# Patient Record
Sex: Female | Born: 1995 | Race: White | Hispanic: No | Marital: Single | Smoking: Never smoker
Health system: Southern US, Community
[De-identification: ages and names within clinical notes are randomized; demographics above are authoritative.]

## PROBLEM LIST (undated history)

## (undated) DIAGNOSIS — J45909 Unspecified asthma, uncomplicated: Secondary | ICD-10-CM

## (undated) DIAGNOSIS — K219 Gastro-esophageal reflux disease without esophagitis: Secondary | ICD-10-CM

## (undated) DIAGNOSIS — F988 Other specified behavioral and emotional disorders with onset usually occurring in childhood and adolescence: Secondary | ICD-10-CM

## (undated) DIAGNOSIS — F419 Anxiety disorder, unspecified: Secondary | ICD-10-CM

## (undated) HISTORY — DX: Unspecified asthma, uncomplicated: J45.909

## (undated) HISTORY — DX: Anxiety disorder, unspecified: F41.9

## (undated) HISTORY — DX: Gastro-esophageal reflux disease without esophagitis: K21.9

## (undated) HISTORY — DX: Other specified behavioral and emotional disorders with onset usually occurring in childhood and adolescence: F98.8

---
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
## 2014-05-15 ENCOUNTER — Institutional Professional Consult (permissible substitution): Payer: 59 | Admitting: Pediatrics

## 2014-05-15 DIAGNOSIS — R279 Unspecified lack of coordination: Secondary | ICD-10-CM

## 2014-05-15 DIAGNOSIS — F909 Attention-deficit hyperactivity disorder, unspecified type: Secondary | ICD-10-CM

## 2014-06-20 ENCOUNTER — Other Ambulatory Visit: Payer: Self-pay | Admitting: Occupational Medicine

## 2014-06-20 ENCOUNTER — Ambulatory Visit: Payer: Self-pay

## 2014-06-20 DIAGNOSIS — R52 Pain, unspecified: Secondary | ICD-10-CM

## 2014-07-23 ENCOUNTER — Institutional Professional Consult (permissible substitution): Payer: 59 | Admitting: Pediatrics

## 2014-07-23 DIAGNOSIS — R279 Unspecified lack of coordination: Secondary | ICD-10-CM

## 2014-07-23 DIAGNOSIS — F909 Attention-deficit hyperactivity disorder, unspecified type: Secondary | ICD-10-CM

## 2014-11-13 ENCOUNTER — Telehealth: Payer: Self-pay | Admitting: Gynecology

## 2014-11-13 NOTE — Telephone Encounter (Signed)
lmtcb to reschedule AEX from 12/25/14 that has been cancelled.

## 2014-12-10 ENCOUNTER — Institutional Professional Consult (permissible substitution): Payer: 59 | Admitting: Pediatrics

## 2014-12-10 DIAGNOSIS — F902 Attention-deficit hyperactivity disorder, combined type: Secondary | ICD-10-CM

## 2014-12-10 DIAGNOSIS — F82 Specific developmental disorder of motor function: Secondary | ICD-10-CM

## 2014-12-24 ENCOUNTER — Telehealth: Payer: Self-pay | Admitting: Certified Nurse Midwife

## 2014-12-24 NOTE — Telephone Encounter (Signed)
Left message to reschedule aex appointment. °

## 2014-12-25 ENCOUNTER — Ambulatory Visit: Payer: Managed Care, Other (non HMO) | Admitting: Gynecology

## 2014-12-26 ENCOUNTER — Encounter: Payer: Self-pay | Admitting: Nurse Practitioner

## 2014-12-26 ENCOUNTER — Ambulatory Visit (INDEPENDENT_AMBULATORY_CARE_PROVIDER_SITE_OTHER): Payer: Managed Care, Other (non HMO) | Admitting: Nurse Practitioner

## 2014-12-26 VITALS — BP 100/60 | HR 72 | Resp 14 | Ht 70.0 in | Wt 131.6 lb

## 2014-12-26 DIAGNOSIS — Z113 Encounter for screening for infections with a predominantly sexual mode of transmission: Secondary | ICD-10-CM

## 2014-12-26 DIAGNOSIS — Z0000 Encounter for general adult medical examination without abnormal findings: Secondary | ICD-10-CM

## 2014-12-26 DIAGNOSIS — Z01419 Encounter for gynecological examination (general) (routine) without abnormal findings: Secondary | ICD-10-CM

## 2014-12-26 DIAGNOSIS — R5383 Other fatigue: Secondary | ICD-10-CM

## 2014-12-26 LAB — POCT URINALYSIS DIPSTICK
Leukocytes, UA: NEGATIVE
UROBILINOGEN UA: NEGATIVE
pH, UA: 5

## 2014-12-26 MED ORDER — NORETHIN ACE-ETH ESTRAD-FE 1-20 MG-MCG PO TABS
1.0000 | ORAL_TABLET | Freq: Every day | ORAL | Status: DC
Start: 2014-12-26 — End: 2015-11-22

## 2014-12-26 NOTE — Progress Notes (Signed)
19 y.o. G0 Single Caucasian Fe here for annual exam.  Menses on OCP is 5 days.  Moderate to light.  Some cramps for 2-3 days. Helps with OTC NSAID's.  Ended long term relationship in October, not SA since.  While  home during break having some fatigue and insomnia - concerned about this problem with going back to school.  She has reduced activity while at home.  Goes to [facility].  Patient's last menstrual period was 12/25/2014.          Sexually active: No.  The current method of family planning is OCP (estrogen/progesterone).    Exercising: Yes.    Walk daily at school Smoker:  no  Health Maintenance: Pap:  Never had one TDaP:  Summer before college  Labs:Hgb: 14.4 ; Urine; RBC's ++ (Cycle), Trace Protein   reports that she has never smoked. She has never used smokeless tobacco. She reports that she drinks about 0.6 oz of alcohol per week. She reports that she does not use illicit drugs.  Past Medical History  Diagnosis Date  . Asthma     Sports Induced  . ADD (attention deficit disorder) since age 83-8    mild hyperactitivy aspect    Past Surgical History  Procedure Laterality Date  . Appendectomy  2011  . Wisdom tooth extraction  2013    Current Outpatient Prescriptions  Medication Sig Dispense Refill  . ALBUTEROL IN Inhale into the lungs as needed.    Marland Kitchen DOXYCYCLINE PO Take by mouth.    . EPIDUO 0.1-2.5 % gel daily.  0  . methylphenidate (DAYTRANA) 30 MG/9HR Place 1 patch onto the skin daily. wear patch for 9 hours only each day    . norethindrone-ethinyl estradiol (JUNEL FE 1/20) 1-20 MG-MCG tablet Take 1 tablet by mouth daily. 3 Package 3   No current facility-administered medications for this visit.    Family History  Problem Relation Age of Onset  . Thyroid disease Father     Hypothyroidism  . Breast cancer Maternal Grandmother 60  . Anxiety disorder Maternal Grandmother   . Hypertension Maternal Grandfather   . Heart disease Maternal Grandfather   .  Heart disease Paternal Grandfather     ROS:  Pertinent items are noted in HPI.  Otherwise, a comprehensive ROS was negative.  Exam:   BP 100/60 mmHg  Pulse 72  Resp 14  Ht  (1.778 m)  Wt 131 lb 9.6 oz (59.693 kg)  BMI 18.88 kg/m2  LMP 12/25/2014 Height:  (177.8 cm)  Ht Readings from Last 3 Encounters:  12/26/14  (1.778 m) (99 %*, Z = 2.25)  12/24/13  (1.803 m) (100 %*, Z = 2.66)   * Growth percentiles are based on CDC 2-20 Years data.    General appearance: alert, cooperative and appears stated age Head: Normocephalic, without obvious abnormality, atraumatic Neck: no adenopathy, supple, symmetrical, trachea midline and thyroid normal to inspection and palpation Lungs: clear to auscultation bilaterally Breasts: normal appearance, no masses or tenderness Heart: regular rate and rhythm Abdomen: soft, non-tender; no masses,  no organomegaly Extremities: extremities normal, atraumatic, no cyanosis or edema Skin: Skin color, texture, turgor normal. No rashes or lesions Lymph nodes: Cervical, supraclavicular, and axillary nodes normal. No abnormal inguinal nodes palpated Neurologic: Grossly normal   Pelvic: External genitalia:  no lesions              Urethra:  normal appearing urethra with no masses, tenderness or lesions  Bartholin's and Skene's: normal                 Vagina: normal appearing vagina with normal color and discharge, no lesions              Cervix: anteverted              Pap taken: No. Bimanual Exam:  Uterus:  normal size, contour, position, consistency, mobility, non-tender              Adnexa: no mass, fullness, tenderness               Rectovaginal: Confirms               Anus:  normal sphincter tone, no lesions  A:  Well Woman with normal exam  Contraception  Med's for Acne  History of ADD  R/O STD's  Fatigue and insomnia  P:   Reviewed health and wellness pertinent to exam  Pap smear not taken today  Refill on  Loestrin for a year, aware if BTB without missed pills may need higher dose of pill.  Follow with labs  Counseled on breast self exam, STD prevention, use and side effects of OCP's, adequate intake of calcium and vitamin D, diet and exercise return annually or prn  An After Visit Summary was printed and given to the patient.

## 2014-12-26 NOTE — Patient Instructions (Signed)
General topics  Next pap or exam is  due in 1 year Take a Women's multivitamin Take 1200 mg. of calcium daily - prefer dietary If any concerns in interim to call back  Breast Self-Awareness Practicing breast self-awareness may pick up problems early, prevent significant medical complications, and possibly save your life. By practicing breast self-awareness, you can become familiar with how your breasts look and feel and if your breasts are changing. This allows you to notice changes early. It can also offer you some reassurance that your breast health is good. One way to learn what is normal for your breasts and whether your breasts are changing is to do a breast self-exam. If you find a lump or something that was not present in the past, it is best to contact your caregiver right away. Other findings that should be evaluated by your caregiver include nipple discharge, especially if it is bloody; skin changes or reddening; areas where the skin seems to be pulled in (retracted); or new lumps and bumps. Breast pain is seldom associated with cancer (malignancy), but should also be evaluated by a caregiver. BREAST SELF-EXAM The best time to examine your breasts is 5 7 days after your menstrual period is over.  ExitCare Patient Information 2013 ExitCare, LLC.   Exercise to Stay Healthy Exercise helps you become and stay healthy. EXERCISE IDEAS AND TIPS Choose exercises that:  You enjoy.  Fit into your day. You do not need to exercise really hard to be healthy. You can do exercises at a slow or medium level and stay healthy. You can:  Stretch before and after working out.  Try yoga, Pilates, or tai chi.  Lift weights.  Walk fast, swim, jog, run, climb stairs, bicycle, dance, or rollerskate.  Take aerobic classes. Exercises that burn about 150 calories:  Running 1  miles in 15 minutes.  Playing volleyball for 45 to 60 minutes.  Washing and waxing a car for 45 to 60  minutes.  Playing touch football for 45 minutes.  Walking 1  miles in 35 minutes.  Pushing a stroller 1  miles in 30 minutes.  Playing basketball for 30 minutes.  Raking leaves for 30 minutes.  Bicycling 5 miles in 30 minutes.  Walking 2 miles in 30 minutes.  Dancing for 30 minutes.  Shoveling snow for 15 minutes.  Swimming laps for 20 minutes.  Walking up stairs for 15 minutes.  Bicycling 4 miles in 15 minutes.  Gardening for 30 to 45 minutes.  Jumping rope for 15 minutes.  Washing windows or floors for 45 to 60 minutes. Document Released: 01/08/2011 Document Revised: 02/28/2012 Document Reviewed: 01/08/2011 ExitCare Patient Information 2013 ExitCare, LLC.   Other topics ( that may be useful information):    Sexually Transmitted Disease Sexually transmitted disease (STD) refers to any infection that is passed from person to person during sexual activity. This may happen by way of saliva, semen, blood, vaginal mucus, or urine. Common STDs include:  Gonorrhea.  Chlamydia.  Syphilis.  HIV/AIDS.  Genital herpes.  Hepatitis B and C.  Trichomonas.  Human papillomavirus (HPV).  Pubic lice. CAUSES  An STD may be spread by bacteria, virus, or parasite. A person can get an STD by:  Sexual intercourse with an infected person.  Sharing sex toys with an infected person.  Sharing needles with an infected person.  Having intimate contact with the genitals, mouth, or rectal areas of an infected person. SYMPTOMS  Some people may not have any symptoms, but   they can still pass the infection to others. Different STDs have different symptoms. Symptoms include:  Painful or bloody urination.  Pain in the pelvis, abdomen, vagina, anus, throat, or eyes.  Skin rash, itching, irritation, growths, or sores (lesions). These usually occur in the genital or anal area.  Abnormal vaginal discharge.  Penile discharge in men.  Soft, flesh-colored skin growths in the  genital or anal area.  Fever.  Pain or bleeding during sexual intercourse.  Swollen glands in the groin area.  Yellow skin and eyes (jaundice). This is seen with hepatitis. DIAGNOSIS  To make a diagnosis, your caregiver may:  Take a medical history.  Perform a physical exam.  Take a specimen (culture) to be examined.  Examine a sample of discharge under a microscope.  Perform blood test TREATMENT   Chlamydia, gonorrhea, trichomonas, and syphilis can be cured with antibiotic medicine.  Genital herpes, hepatitis, and HIV can be treated, but not cured, with prescribed medicines. The medicines will lessen the symptoms.  Genital warts from HPV can be treated with medicine or by freezing, burning (electrocautery), or surgery. Warts may come back.  HPV is a virus and cannot be cured with medicine or surgery.However, abnormal areas may be followed very closely by your caregiver and may be removed from the cervix, vagina, or vulva through office procedures or surgery. If your diagnosis is confirmed, your recent sexual partners need treatment. This is true even if they are symptom-free or have a negative culture or evaluation. They should not have sex until their caregiver says it is okay. HOME CARE INSTRUCTIONS  All sexual partners should be informed, tested, and treated for all STDs.  Take your antibiotics as directed. Finish them even if you start to feel better.  Only take over-the-counter or prescription medicines for pain, discomfort, or fever as directed by your caregiver.  Rest.  Eat a balanced diet and drink enough fluids to keep your urine clear or pale yellow.  Do not have sex until treatment is completed and you have followed up with your caregiver. STDs should be checked after treatment.  Keep all follow-up appointments, Pap tests, and blood tests as directed by your caregiver.  Only use latex condoms and water-soluble lubricants during sexual activity. Do not use  petroleum jelly or oils.  Avoid alcohol and illegal drugs.  Get vaccinated for HPV and hepatitis. If you have not received these vaccines in the past, talk to your caregiver about whether one or both might be right for you.  Avoid risky sex practices that can break the skin. The only way to avoid getting an STD is to avoid all sexual activity.Latex condoms and dental dams (for oral sex) will help lessen the risk of getting an STD, but will not completely eliminate the risk. SEEK MEDICAL CARE IF:   You have a fever.  You have any new or worsening symptoms. Document Released: 02/26/2003 Document Revised: 02/28/2012 Document Reviewed: 03/05/2011 [facility] Patient Information 2013 [employer].    Domestic Abuse You are being battered or abused if someone close to you hits, pushes, or physically hurts you in any way. You also are being abused if you are forced into activities. You are being sexually abused if you are forced to have sexual contact of any kind. You are being emotionally abused if you are made to feel worthless or if you are constantly threatened. It is important to remember that help is available. No one has the right to abuse you. PREVENTION OF FURTHER  ABUSE  Learn the warning signs of danger. This varies with situations but may include: the use of alcohol, threats, isolation from friends and family, or forced sexual contact. Leave if you feel that violence is going to occur.  If you are attacked or beaten, report it to the police so the abuse is documented. You do not have to press charges. The police can protect you while you or the attackers are leaving. Get the officer's name and badge number and a copy of the report.  Find someone you can trust and tell them what is happening to you: your caregiver, a nurse, clergy member, close friend or family member. Feeling ashamed is natural, but remember that you have done nothing wrong. No one deserves abuse. Document Released:  12/03/2000 Document Revised: 02/28/2012 Document Reviewed: 02/11/2011 ExitCare Patient Information 2013 ExitCare, LLC.    How Much is Too Much Alcohol? Drinking too much alcohol can cause injury, accidents, and health problems. These types of problems can include:   Car crashes.  Falls.  Family fighting (domestic violence).  Drowning.  Fights.  Injuries.  Burns.  Damage to certain organs.  Having a baby with birth defects. ONE DRINK CAN BE TOO MUCH WHEN YOU ARE:  Working.  Pregnant or breastfeeding.  Taking medicines. Ask your doctor.  Driving or planning to drive. If you or someone you know has a drinking problem, get help from a doctor.  Document Released: 10/02/2009 Document Revised: 02/28/2012 Document Reviewed: 10/02/2009 ExitCare Patient Information 2013 ExitCare, LLC.   Smoking Hazards Smoking cigarettes is extremely bad for your health. Tobacco smoke has over 200 known poisons in it. There are over 60 chemicals in tobacco smoke that cause cancer. Some of the chemicals found in cigarette smoke include:   Cyanide.  Benzene.  Formaldehyde.  Methanol (wood alcohol).  Acetylene (fuel used in welding torches).  Ammonia. Cigarette smoke also contains the poisonous gases nitrogen oxide and carbon monoxide.  Cigarette smokers have an increased risk of many serious medical problems and Smoking causes approximately:  90% of all lung cancer deaths in men.  80% of all lung cancer deaths in women.  90% of deaths from chronic obstructive lung disease. Compared with nonsmokers, smoking increases the risk of:  Coronary heart disease by 2 to 4 times.  Stroke by 2 to 4 times.  Men developing lung cancer by 23 times.  Women developing lung cancer by 13 times.  Dying from chronic obstructive lung diseases by 12 times.  . Smoking is the most preventable cause of death and disease in our society.  WHY IS SMOKING ADDICTIVE?  Nicotine is the chemical  agent in tobacco that is capable of causing addiction or dependence.  When you smoke and inhale, nicotine is absorbed rapidly into the bloodstream through your lungs. Nicotine absorbed through the lungs is capable of creating a powerful addiction. Both inhaled and non-inhaled nicotine may be addictive.  Addiction studies of cigarettes and spit tobacco show that addiction to nicotine occurs mainly during the teen years, when young people begin using tobacco products. WHAT ARE THE BENEFITS OF QUITTING?  There are many health benefits to quitting smoking.   Likelihood of developing cancer and heart disease decreases. Health improvements are seen almost immediately.  Blood pressure, pulse rate, and breathing patterns start returning to normal soon after quitting. QUITTING SMOKING   American Lung Association - 1-800-LUNGUSA  American Cancer Society - 1-800-ACS-2345 Document Released: 01/13/2005 Document Revised: 02/28/2012 Document Reviewed: 09/17/2009 ExitCare Patient Information 2013 ExitCare,   LLC.   Stress Management Stress is a state of physical or mental tension that often results from changes in your life or normal routine. Some common causes of stress are:  Death of a loved one.  Injuries or severe illnesses.  Getting fired or changing jobs.  Moving into a new home. Other causes may be:  Sexual problems.  Business or financial losses.  Taking on a large debt.  Regular conflict with someone at home or at work.  Constant tiredness from lack of sleep. It is not just bad things that are stressful. It may be stressful to:  Win the lottery.  Get married.  Buy a new car. The amount of stress that can be easily tolerated varies from person to person. Changes generally cause stress, regardless of the types of change. Too much stress can affect your health. It may lead to physical or emotional problems. Too little stress (boredom) may also become stressful. SUGGESTIONS TO  REDUCE STRESS:  Talk things over with your family and friends. It often is helpful to share your concerns and worries. If you feel your problem is serious, you may want to get help from a professional counselor.  Consider your problems one at a time instead of lumping them all together. Trying to take care of everything at once may seem impossible. List all the things you need to do and then start with the most important one. Set a goal to accomplish 2 or 3 things each day. If you expect to do too many in a single day you will naturally fail, causing you to feel even more stressed.  Do not use alcohol or drugs to relieve stress. Although you may feel better for a short time, they do not remove the problems that caused the stress. They can also be habit forming.  Exercise regularly - at least 3 times per week. Physical exercise can help to relieve that "uptight" feeling and will relax you.  The shortest distance between despair and hope is often a good night's sleep.  Go to bed and get up on time allowing yourself time for appointments without being rushed.  Take a short "time-out" period from any stressful situation that occurs during the day. Close your eyes and take some deep breaths. Starting with the muscles in your face, tense them, hold it for a few seconds, then relax. Repeat this with the muscles in your neck, shoulders, hand, stomach, back and legs.  Take good care of yourself. Eat a balanced diet and get plenty of rest.  Schedule time for having fun. Take a break from your daily routine to relax. HOME CARE INSTRUCTIONS   Call if you feel overwhelmed by your problems and feel you can no longer manage them on your own.  Return immediately if you feel like hurting yourself or someone else. Document Released: 06/01/2001 Document Revised: 02/28/2012 Document Reviewed: 01/22/2008 ExitCare Patient Information 2013 ExitCare, LLC.  

## 2014-12-27 ENCOUNTER — Ambulatory Visit: Payer: Managed Care, Other (non HMO) | Admitting: Certified Nurse Midwife

## 2014-12-27 LAB — CBC WITH DIFFERENTIAL/PLATELET
BASOS PCT: 0 % (ref 0–1)
Basophils Absolute: 0 10*3/uL (ref 0.0–0.1)
Eosinophils Absolute: 0 10*3/uL (ref 0.0–0.7)
Eosinophils Relative: 0 % (ref 0–5)
HEMATOCRIT: 42.7 % (ref 36.0–46.0)
Hemoglobin: 14.8 g/dL (ref 12.0–15.0)
Lymphocytes Relative: 25 % (ref 12–46)
Lymphs Abs: 1.7 10*3/uL (ref 0.7–4.0)
MCH: 31.1 pg (ref 26.0–34.0)
MCHC: 34.7 g/dL (ref 30.0–36.0)
MCV: 89.7 fL (ref 78.0–100.0)
MONO ABS: 0.4 10*3/uL (ref 0.1–1.0)
MPV: 10.7 fL (ref 8.6–12.4)
Monocytes Relative: 6 % (ref 3–12)
NEUTROS ABS: 4.7 10*3/uL (ref 1.7–7.7)
NEUTROS PCT: 69 % (ref 43–77)
Platelets: 233 10*3/uL (ref 150–400)
RBC: 4.76 MIL/uL (ref 3.87–5.11)
RDW: 12.7 % (ref 11.5–15.5)
WBC: 6.8 10*3/uL (ref 4.0–10.5)

## 2014-12-27 LAB — STD PANEL
HEP B S AG: NEGATIVE
HIV: NONREACTIVE

## 2014-12-27 LAB — TSH: TSH: 0.392 u[IU]/mL (ref 0.350–4.500)

## 2014-12-27 LAB — HEMOGLOBIN, FINGERSTICK: Hemoglobin, fingerstick: 14.4 g/dL (ref 12.0–16.0)

## 2014-12-29 NOTE — Progress Notes (Signed)
Encounter reviewed by Dr. Brook Silva.  

## 2014-12-31 LAB — IPS N GONORRHOEA AND CHLAMYDIA BY PCR

## 2015-02-07 ENCOUNTER — Telehealth: Payer: Self-pay | Admitting: Nurse Practitioner

## 2015-02-07 NOTE — Telephone Encounter (Signed)
Spoke with patient's mother Veronica Rice (okay per ROI) who states that patient is still experiencing fatigue and was seen [facility] at school. [facility] center recommends that patient have blood work done. Mother calling to see what blood work was done in January with Lauro FranklinPatricia Rolen-Grubb, FNP. Advised at OV in January CBC with differential, TSH, and hemoglobin level were checked. Mother is agreeable and will have patient continue with follow up at school.  Routing to provider for final review. Patient agreeable to disposition. Will close encounter

## 2015-02-07 NOTE — Telephone Encounter (Signed)
Patient's mom (dpr on file to share PHI) calling requesting to speak with the nurse about recent lab results.

## 2015-02-26 ENCOUNTER — Institutional Professional Consult (permissible substitution): Payer: 59 | Admitting: Pediatrics

## 2015-02-26 DIAGNOSIS — F902 Attention-deficit hyperactivity disorder, combined type: Secondary | ICD-10-CM

## 2015-02-26 DIAGNOSIS — F8181 Disorder of written expression: Secondary | ICD-10-CM

## 2015-05-21 HISTORY — PX: NOSE SURGERY: SHX723

## 2015-07-16 ENCOUNTER — Institutional Professional Consult (permissible substitution): Payer: 59 | Admitting: Pediatrics

## 2015-07-16 DIAGNOSIS — F902 Attention-deficit hyperactivity disorder, combined type: Secondary | ICD-10-CM | POA: Diagnosis not present

## 2015-07-16 DIAGNOSIS — F8181 Disorder of written expression: Secondary | ICD-10-CM | POA: Diagnosis not present

## 2015-10-26 ENCOUNTER — Emergency Department (HOSPITAL_BASED_OUTPATIENT_CLINIC_OR_DEPARTMENT_OTHER)
Admission: EM | Admit: 2015-10-26 | Discharge: 2015-10-26 | Disposition: A | Discharge status: Home / Self Care | Payer: Managed Care, Other (non HMO) | Attending: Emergency Medicine | Admitting: Emergency Medicine

## 2015-10-26 ENCOUNTER — Emergency Department (HOSPITAL_BASED_OUTPATIENT_CLINIC_OR_DEPARTMENT_OTHER): Payer: Managed Care, Other (non HMO)

## 2015-10-26 ENCOUNTER — Encounter (HOSPITAL_BASED_OUTPATIENT_CLINIC_OR_DEPARTMENT_OTHER): Payer: Self-pay | Admitting: Emergency Medicine

## 2015-10-26 DIAGNOSIS — Z880 Allergy status to penicillin: Secondary | ICD-10-CM | POA: Diagnosis not present

## 2015-10-26 DIAGNOSIS — F909 Attention-deficit hyperactivity disorder, unspecified type: Secondary | ICD-10-CM | POA: Diagnosis not present

## 2015-10-26 DIAGNOSIS — J45909 Unspecified asthma, uncomplicated: Secondary | ICD-10-CM | POA: Insufficient documentation

## 2015-10-26 DIAGNOSIS — Z792 Long term (current) use of antibiotics: Secondary | ICD-10-CM | POA: Diagnosis not present

## 2015-10-26 DIAGNOSIS — G43109 Migraine with aura, not intractable, without status migrainosus: Secondary | ICD-10-CM | POA: Insufficient documentation

## 2015-10-26 DIAGNOSIS — Z79899 Other long term (current) drug therapy: Secondary | ICD-10-CM | POA: Diagnosis not present

## 2015-10-26 DIAGNOSIS — Z3202 Encounter for pregnancy test, result negative: Secondary | ICD-10-CM | POA: Insufficient documentation

## 2015-10-26 DIAGNOSIS — R200 Anesthesia of skin: Secondary | ICD-10-CM | POA: Insufficient documentation

## 2015-10-26 DIAGNOSIS — R51 Headache: Secondary | ICD-10-CM | POA: Diagnosis present

## 2015-10-26 LAB — BASIC METABOLIC PANEL
ANION GAP: 8 (ref 5–15)
BUN: 9 mg/dL (ref 6–20)
CALCIUM: 9.4 mg/dL (ref 8.9–10.3)
CO2: 27 mmol/L (ref 22–32)
CREATININE: 0.84 mg/dL (ref 0.44–1.00)
Chloride: 104 mmol/L (ref 101–111)
GFR calc Af Amer: >60 mL/min (ref >60)
GFR calc non Af Amer: >60 mL/min (ref >60)
GLUCOSE: 90 mg/dL (ref 65–99)
Potassium: 3.8 mmol/L (ref 3.5–5.1)
Sodium: 139 mmol/L (ref 135–145)

## 2015-10-26 LAB — CBC WITH DIFFERENTIAL/PLATELET
Basophils Absolute: 0 10*3/uL (ref 0.0–0.1)
Basophils Relative: 0 %
EOS PCT: 0 %
Eosinophils Absolute: 0 10*3/uL (ref 0.0–0.7)
HEMATOCRIT: 45.1 % (ref 36.0–46.0)
Hemoglobin: 15.2 g/dL — ABNORMAL HIGH (ref 12.0–15.0)
LYMPHS ABS: 2.4 10*3/uL (ref 0.7–4.0)
LYMPHS PCT: 27 %
MCH: 30.7 pg (ref 26.0–34.0)
MCHC: 33.7 g/dL (ref 30.0–36.0)
MCV: 91.1 fL (ref 78.0–100.0)
MONO ABS: 0.4 10*3/uL (ref 0.1–1.0)
MONOS PCT: 5 %
NEUTROS ABS: 5.9 10*3/uL (ref 1.7–7.7)
Neutrophils Relative %: 68 %
Platelets: 241 10*3/uL (ref 150–400)
RBC: 4.95 MIL/uL (ref 3.87–5.11)
RDW: 12.4 % (ref 11.5–15.5)
WBC: 8.7 10*3/uL (ref 4.0–10.5)

## 2015-10-26 LAB — URINALYSIS, ROUTINE W REFLEX MICROSCOPIC
Bilirubin Urine: NEGATIVE
Glucose, UA: NEGATIVE mg/dL
HGB URINE DIPSTICK: NEGATIVE
Ketones, ur: NEGATIVE mg/dL
Leukocytes, UA: NEGATIVE
Nitrite: NEGATIVE
PH: 7 (ref 5.0–8.0)
Protein, ur: NEGATIVE mg/dL
SPECIFIC GRAVITY, URINE: 1.012 (ref 1.005–1.030)
UROBILINOGEN UA: 0.2 mg/dL (ref 0.0–1.0)

## 2015-10-26 LAB — PREGNANCY, URINE: Preg Test, Ur: NEGATIVE

## 2015-10-26 MED ORDER — SODIUM CHLORIDE 0.9 % IV BOLUS (SEPSIS)
1000.0000 mL | Freq: Once | INTRAVENOUS | Status: AC
  Administered 2015-10-26: 1000 mL via INTRAVENOUS

## 2015-10-26 MED ORDER — METOCLOPRAMIDE HCL 5 MG/ML IJ SOLN
10.0000 mg | INTRAMUSCULAR | Status: AC
  Administered 2015-10-26: 10 mg via INTRAVENOUS
  Filled 2015-10-26: qty 2

## 2015-10-26 MED ORDER — DIPHENHYDRAMINE HCL 50 MG/ML IJ SOLN
25.0000 mg | Freq: Once | INTRAMUSCULAR | Status: AC
  Administered 2015-10-26: 25 mg via INTRAVENOUS
  Filled 2015-10-26: qty 1

## 2015-10-26 NOTE — ED Notes (Addendum)
Patient c/o vision blurring last night while using her cell phone, she had a headache behind her right eye, and started having numbness to her left hand that radiated up into her face. Patient mother states  "it was like she had novocain and couldn't talk". The numbness improved but the headache improved. Patient states today her headache is lessened but the numbness is resolved, and has some nausea. Patient has not taken anything for the headache. Patient denies any recent injuries, [facility], had a concussion and knot to left side of head at mastoid at that time. Patient and mother add that the patient has recently been under a large amount of stress.

## 2015-10-26 NOTE — ED Provider Notes (Signed)
CSN: [id number]     Arrival date & time 10/26/15  1423 History   First MD Initiated Contact with Patient 10/26/15 1521     Chief Complaint  Patient presents with  . Headache  . Numbness  . Nausea     (Consider location/radiation/quality/duration/timing/severity/associated sxs/prior Treatment) Patient is a 19 y.o. female presenting with headaches. The history is provided by the patient and medical records.  Headache  19 year old female with history of asthma and ADD, presenting to the ED for headache. Patient states last night she was lying on the couch, scrolling through her phone and watching television when she developed a headache behind her right eye. Patient states pain was sharp, stabbing in nature without radiation, associated with some blurred vision in both eyes.  Patient thought initially this was because she had been looking at her phone for a while, laid phone down and rested for approximately 30 minutes. States patient went back to normal, however she continued having headache and developed numbness in her left hand which traveled up her left arm and into her face. States she began tying to talk to her mom, speech appeared somewhat slurred without facial droop. Patient states it felt as though she had been given lidocaine at the dentist on the left side of her mouth.  Numbness of left arm/face and slurred speech lasted approx 20-30 mins before resolving, however headache persisted. She has had some nausea without vomiting.  She denies any fever, chills, neck pain. She has no history of migraine headaches, there is a family history of this on her father's side. No recent head injury, falls, or trauma. [facility] and suffered a concussion at that time, no ongoing symptoms from this. 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 and has been under significant stress recently. She has not taken any medications for her symptoms prior to arrival.  Past Medical History   Diagnosis Date  . Asthma     Sports Induced  . ADD (attention deficit disorder) since age 51-8    mild hyperactitivy aspect   Past Surgical History  Procedure Laterality Date  . Appendectomy  2011  . Wisdom tooth extraction  2013  . Nose surgery  05/2015   Family History  Problem Relation Age of Onset  . Thyroid disease Father     Hypothyroidism  . Breast cancer Maternal Grandmother 60  . Anxiety disorder Maternal Grandmother   . Hypertension Maternal Grandfather   . Heart disease Maternal Grandfather   . Heart disease Paternal Grandfather    Social History  Substance Use Topics  . Smoking status: Never Smoker   . Smokeless tobacco: Never Used  . Alcohol Use: 0.6 oz/week    1 Glasses of wine, 0 Standard drinks or equivalent per week     Comment: occ   OB History    Gravida Para Term Preterm AB TAB SAB Ectopic Multiple Living   0              Review of Systems  Neurological: Positive for headaches.  All other systems reviewed and are negative.     Allergies  Amoxicillin  Home Medications   Prior to Admission medications   Medication Sig Start Date End Date Taking? Authorizing Provider  ALBUTEROL IN Inhale into the lungs as needed.    Historical Provider, MD  DOXYCYCLINE PO Take by mouth.    Historical Provider, MD  EPIDUO 0.1-2.5 % gel daily. 10/31/14   Historical Provider, MD  methylphenidate (DAYTRANA) 30 MG/9HR Place 1 patch onto the skin daily. wear patch for 9 hours only each day    Historical Provider, MD  norethindrone-ethinyl estradiol (JUNEL FE 1/20) 1-20 MG-MCG tablet Take 1 tablet by mouth daily. 12/26/14   Ria Comment, FNP   BP 134/91 mmHg  Pulse 69  Temp(Src) 98 F (36.7 C) (Oral)  Resp 16  Ht 5' 10.5" (1.791 m)  Wt 140 lb (63.504 kg)  BMI 19.80 kg/m2  SpO2 100%  LMP 10/05/2015 (Exact Date)   Physical Exam  Constitutional: She is oriented to person, place, and time. She appears well-developed and well-nourished. No distress.  HENT:   Head: Normocephalic and atraumatic.  Mouth/Throat: Oropharynx is clear and moist.  Eyes: Conjunctivae and EOM are normal. Pupils are equal, round, and reactive to light.  Neck: Normal range of motion and full passive range of motion without pain. No spinous process tenderness and no muscular tenderness present. No rigidity.  No rigidity, no meningismus  Cardiovascular: Normal rate, regular rhythm and normal heart sounds.   Pulmonary/Chest: Effort normal and breath sounds normal. No respiratory distress. She has no wheezes.  Abdominal: Soft. Bowel sounds are normal.  Musculoskeletal: Normal range of motion.  Neurological: She is alert and oriented to person, place, and time.  AAOx3, answering questions appropriately; equal strength UE and LE bilaterally; CN grossly intact; moves all extremities appropriately without ataxia; normal finger to nose bilaterally without dysmetria; negative pronator drift; no facial asymmetry, speech is clear and goal oriented  Skin: Skin is warm and dry. She is not diaphoretic.  Psychiatric: She has a normal mood and affect.  Nursing note and vitals reviewed.   ED Course  Procedures (including critical care time) Labs Review Labs Reviewed  CBC WITH DIFFERENTIAL/PLATELET - Abnormal; Notable for the following:    Hemoglobin 15.2 (*)    All other components within normal limits  URINALYSIS, ROUTINE W REFLEX MICROSCOPIC (NOT AT [facility]) - Abnormal; Notable for the following:    APPearance CLOUDY (*)    All other components within normal limits  BASIC METABOLIC PANEL  PREGNANCY, URINE    Imaging Review Ct Head Wo Contrast  10/26/2015  CLINICAL DATA:  Left sided headache with blurred vision. Resolved episode of left upper extremity numbness. EXAM: CT HEAD WITHOUT CONTRAST TECHNIQUE: Contiguous axial images were obtained from the base of the skull through the vertex without intravenous contrast. COMPARISON:  None. FINDINGS: No evidence of parenchymal hemorrhage or  extra-axial fluid collection. No mass lesion, mass effect, or midline shift. No CT evidence of acute infarction. Cerebral volume is age appropriate. No ventriculomegaly. The visualized paranasal sinuses are essentially clear. The mastoid air cells are unopacified. No evidence of calvarial fracture. IMPRESSION: No evidence of acute intracranial abnormality. Electronically Signed   By: Delbert Phenix M.D.   On: 10/26/2015 15:58   I have personally reviewed and evaluated these images and lab results as part of my medical decision-making.   EKG Interpretation None      MDM   Final diagnoses:  Complicated migraine   19 year old female here with headache and transient left-sided numbness and slurred speech which resolved yesterday evening after approximately 30 minutes. Patient needs have a headache, all other symptoms have resolved. Patient is afebrile, nontoxic. She has no focal neurologic deficits on exam. No clinical signs of meningitis. CT head is negative for acute findings. Labwork is reassuring. Patient was treated with migraine cocktail of Benadryl, Reglan, and IV fluids with resolution of headache. She has  not had any recurrence of left-sided numbness or slurred speech.  I have clinical suspicion that this is a consultative migraine, however will speak with neuro hospitalist prior to discharge.  5:02 PM Case discussed with neurohospitalist, Dr. Amada Jupiter-- agrees that this is likely complicated migraine given + symptoms of paresthesias instead of negative and patient has no risk factors for TIA/stroke.  Agrees emergent MRI not indicated currently.  May follow-up with neurology as an outpatient.    Plan was discussed with patient and mother, comfortable with discharge home at this time.  Neurology follow-up was given, otherwise may follow-up with PCP.  Recommended OTC excedrin migraine if needed for recurrent headache.  Discussed plan with patient, he/she acknowledged understanding and agreed  with plan of care.  Return precautions given for new or worsening symptoms.  Garlon Hatchet, PA-C 10/26/15 1834  Arby Barrette, MD 10/28/15 [phone]

## 2015-10-26 NOTE — Discharge Instructions (Signed)
May take Excedrin Migraine or other over-the-counter medication as needed for recurrent headache. Follow-up with neurology-- call to make appointment. Otherwise you may follow-up with your primary care physician. Return here for any new or worsening symptoms including recurrent numbness, weakness, facial droop, slurred speech, confusion, or changes in mental status.  Migraine Headache A migraine headache is an intense, throbbing pain on one or both sides of your head. A migraine can last for 30 minutes to several hours. CAUSES  The exact cause of a migraine headache is not always known. However, a migraine may be caused when nerves in the brain become irritated and release chemicals that cause inflammation. This causes pain. Certain things may also trigger migraines, such as:  Alcohol.  Smoking.  Stress.  Menstruation.  Aged cheeses.  Foods or drinks that contain nitrates, glutamate, aspartame, or tyramine.  Lack of sleep.  Chocolate.  Caffeine.  Hunger.  Physical exertion.  Fatigue.  Medicines used to treat chest pain (nitroglycerine), birth control pills, estrogen, and some blood pressure medicines. SIGNS AND SYMPTOMS  Pain on one or both sides of your head.  Pulsating or throbbing pain.  Severe pain that prevents daily activities.  Pain that is aggravated by any physical activity.  Nausea, vomiting, or both.  Dizziness.  Pain with exposure to bright lights, loud noises, or activity.  General sensitivity to bright lights, loud noises, or smells. Before you get a migraine, you may get warning signs that a migraine is coming (aura). An aura may include:  Seeing flashing lights.  Seeing bright spots, halos, or zigzag lines.  Having tunnel vision or blurred vision.  Having feelings of numbness or tingling.  Having trouble talking.  Having muscle weakness. DIAGNOSIS  A migraine headache is often diagnosed based on:  Symptoms.  Physical exam.  A CT  scan or MRI of your head. These imaging tests cannot diagnose migraines, but they can help rule out other causes of headaches. TREATMENT Medicines may be given for pain and nausea. Medicines can also be given to help prevent recurrent migraines.  HOME CARE INSTRUCTIONS  Only take over-the-counter or prescription medicines for pain or discomfort as directed by your health care provider. The use of long-term narcotics is not recommended.  Lie down in a dark, quiet room when you have a migraine.  Keep a journal to find out what may trigger your migraine headaches. For example, write down:  What you eat and drink.  How much sleep you get.  Any change to your diet or medicines.  Limit alcohol consumption.  Quit smoking if you smoke.  Get 7-9 hours of sleep, or as recommended by your health care provider.  Limit stress.  Keep lights dim if bright lights bother you and make your migraines worse. SEEK IMMEDIATE MEDICAL CARE IF:   Your migraine becomes severe.  You have a fever.  You have a stiff neck.  You have vision loss.  You have muscular weakness or loss of muscle control.  You start losing your balance or have trouble walking.  You feel faint or pass out.  You have severe symptoms that are different from your first symptoms. MAKE SURE YOU:   Understand these instructions.  Will watch your condition.  Will get help right away if you are not doing well or get worse.   This information is not intended to replace advice given to you by your health care provider. Make sure you discuss any questions you have with your health care provider.  Document Released: 12/06/2005 Document Revised: 12/27/2014 Document Reviewed: 08/13/2013 Elsevier Interactive Patient Education [employer].

## 2015-11-22 ENCOUNTER — Other Ambulatory Visit: Payer: Self-pay | Admitting: Nurse Practitioner

## 2015-11-24 NOTE — Telephone Encounter (Signed)
Medication refill request: Blisovi FE Last AEX:  12-26-14 Next AEX: not scheduled- left message for patient to call and schedulle Last MMG (if hormonal medication request): N/A Refill authorized: please advise

## 2015-12-08 ENCOUNTER — Other Ambulatory Visit: Payer: Self-pay | Admitting: Nurse Practitioner

## 2015-12-08 NOTE — Telephone Encounter (Signed)
Medication refill request:Blisovi FE 1/20 Last AEX:  12-27-14 Next AEX: 12-29-15 Last MMG (if hormonal medication request): n/a Refill authorized: pt needs refill to get to her aex. Please approve rx

## 2015-12-18 ENCOUNTER — Institutional Professional Consult (permissible substitution): Payer: Managed Care, Other (non HMO) | Admitting: Pediatrics

## 2015-12-18 DIAGNOSIS — F8181 Disorder of written expression: Secondary | ICD-10-CM | POA: Diagnosis not present

## 2015-12-18 DIAGNOSIS — F902 Attention-deficit hyperactivity disorder, combined type: Secondary | ICD-10-CM | POA: Diagnosis not present

## 2015-12-29 ENCOUNTER — Ambulatory Visit (INDEPENDENT_AMBULATORY_CARE_PROVIDER_SITE_OTHER): Payer: Managed Care, Other (non HMO) | Admitting: Nurse Practitioner

## 2015-12-29 ENCOUNTER — Encounter: Payer: Self-pay | Admitting: Nurse Practitioner

## 2015-12-29 VITALS — BP 120/74 | HR 64 | Ht 69.5 in | Wt 139.0 lb

## 2015-12-29 DIAGNOSIS — Z113 Encounter for screening for infections with a predominantly sexual mode of transmission: Secondary | ICD-10-CM

## 2015-12-29 DIAGNOSIS — Z01419 Encounter for gynecological examination (general) (routine) without abnormal findings: Secondary | ICD-10-CM | POA: Diagnosis not present

## 2015-12-29 DIAGNOSIS — Z0000 Encounter for general adult medical examination without abnormal findings: Secondary | ICD-10-CM | POA: Diagnosis not present

## 2015-12-29 LAB — POCT URINALYSIS DIPSTICK
BILIRUBIN UA: NEGATIVE
GLUCOSE UA: NEGATIVE
KETONES UA: NEGATIVE
LEUKOCYTES UA: NEGATIVE
NITRITE UA: NEGATIVE
PH UA: 6.5
Protein, UA: NEGATIVE
Urobilinogen, UA: NEGATIVE

## 2015-12-29 LAB — HEMOGLOBIN, FINGERSTICK: Hemoglobin, fingerstick: 15.1 g/dL (ref 12.0–16.0)

## 2015-12-29 NOTE — Progress Notes (Signed)
Patient ID: Veronica Rice, female   DOB: 1996/01/30, 20 y.o.   MRN: [id number]  20 y.o. G0P0 Single  Caucasian Fe here for annual exam. Menses now at 5 days.  Some cramps.  New partner for 3-4 months.  He was her former partner from [employer], they broke up for awhile and now back together.  She does request STD's.  She will get BTB if misses or several hours late of taking OCP.  She has tried [employer] in the past and did not do well.  She does not want Nexplanon or IUD.  She will work on better compliance with pill.  Patient's last menstrual period was 12/27/2015 (exact date).          Sexually active: Yes.    The current method of family planning is OCP (estrogen/progesterone) and condoms most of the time.    Exercising: Yes.    walking and running Smoker:  no  Health Maintenance: Pap:  Never TDaP:  2015  Hep C not indicated due to age; HIV completed 12/26/14 Labs: HB: 15.1  Urine: Mod RBC (menses)   reports that she has never smoked. She has never used smokeless tobacco. She reports that she drinks about 0.6 oz of alcohol per week. She reports that she does not use illicit drugs.  Past Medical History  Diagnosis Date  . Asthma     Sports Induced  . ADD (attention deficit disorder) since age 43-8    mild hyperactitivy aspect    Past Surgical History  Procedure Laterality Date  . Appendectomy  2011  . Wisdom tooth extraction  2013  . Nose surgery  05/2015    turbinate reduction and fracture correction    Current Outpatient Prescriptions  Medication Sig Dispense Refill  . ALBUTEROL IN Inhale into the lungs as needed.    Marland Kitchen BLISOVI FE 1/20 1-20 MG-MCG tablet TAKE ONE TABLET BY MOUTH ONE TIME DAILY 28 tablet 0  . EPIDUO 0.1-2.5 % gel daily.  0  . methylphenidate (DAYTRANA) 30 MG/9HR Place 1 patch onto the skin daily. wear patch for 9 hours only each day     No current facility-administered medications for this visit.    Family History  Problem Relation Age of Onset  .  Thyroid disease Father     Hypothyroidism  . Breast cancer Maternal Grandmother 60  . Anxiety disorder Maternal Grandmother   . Hypertension Maternal Grandfather   . Heart disease Maternal Grandfather   . Heart disease Paternal Grandfather   . Colon cancer Paternal Grandfather   . Parkinson's disease Paternal Grandfather     ROS:  Pertinent items are noted in HPI.  Otherwise, a comprehensive ROS was negative.  Exam:   BP 120/74 mmHg  Pulse 64  Ht 5' 9.5" (1.765 m)  Wt 139 lb (63.05 kg)  BMI 20.24 kg/m2  LMP 12/27/2015 (Exact Date) Height: 5' 9.5" (176.5 cm) Ht Readings from Last 3 Encounters:  12/29/15 5' 9.5" (1.765 m)  10/26/15 5' 10.5" (1.791 m) (99 %*, Z = 2.44)  12/26/14 5\' 10"  (1.778 m) (99 %*, Z = 2.25)   * Growth percentiles are based on CDC 2-20 Years data.    General appearance: alert, cooperative and appears stated age Head: Normocephalic, without obvious abnormality, atraumatic Neck: no adenopathy, supple, symmetrical, trachea midline and thyroid normal to inspection and palpation Lungs: clear to auscultation bilaterally Breasts: normal appearance, no masses or tenderness Heart: regular rate and rhythm Abdomen: soft, non-tender; no masses,  no organomegaly Extremities: extremities normal, atraumatic, no cyanosis or edema Skin: Skin color, texture, turgor normal. No rashes or lesions Lymph nodes: Cervical, supraclavicular, and axillary nodes normal. No abnormal inguinal nodes palpated Neurologic: Grossly normal   Pelvic: External genitalia:  no lesions              Urethra:  normal appearing urethra with no masses, tenderness or lesions              Bartholin's and Skene's: normal                 Vagina: normal appearing vagina with normal color and discharge, no lesions              Cervix: anteverted              Pap taken: No. Bimanual Exam:  Uterus:  normal size, contour, position, consistency, mobility, non-tender              Adnexa: no mass,  fullness, tenderness               Rectovaginal: Confirms               Anus:  normal sphincter tone, no lesions  Chaperone present: yes  A:  Well Woman with normal exam  Contraception Med's for Acne History of ADD R/O STD's   P:   Reviewed health and wellness pertinent to exam  Pap smear as above  Refill on OCP for a year  Counseled on breast self exam, STD prevention, HIV risk factors and prevention, use and side effects of OCP's, adequate intake of calcium and vitamin D, diet and exercise return annually or prn  An After Visit Summary was printed and given to the patient.

## 2015-12-30 LAB — STD PANEL
HIV: NONREACTIVE
Hepatitis B Surface Ag: NEGATIVE

## 2015-12-30 LAB — IPS N GONORRHOEA AND CHLAMYDIA BY PCR

## 2015-12-30 NOTE — Progress Notes (Signed)
Encounter reviewed Veronica Banales, MD   

## 2016-03-03 ENCOUNTER — Other Ambulatory Visit: Payer: Self-pay | Admitting: *Deleted

## 2016-03-03 MED ORDER — NORETHIN ACE-ETH ESTRAD-FE 1-20 MG-MCG PO TABS: 1.0000 | ORAL_TABLET | Freq: Every day | ORAL | Status: DC

## 2016-03-03 NOTE — Telephone Encounter (Signed)
Patient's mother [facility] when she was here for her AEX 12/29/15. [facility] [place]  Medication refill request: Blisovi 1/20 mcg  Last AEX:  12/29/15 with PG Next AEX: 12/29/16 with PG Last MMG (if hormonal medication request): N/A Refill authorized: #[id number] rfs

## 2016-03-03 NOTE — Telephone Encounter (Signed)
Patient's mother notified that rx has been sent to pharmacy.

## 2016-03-18 ENCOUNTER — Other Ambulatory Visit: Payer: Self-pay | Admitting: Pediatrics

## 2016-03-18 NOTE — Telephone Encounter (Signed)
Mom called for refill for Daytrana 30 mg.  Patient last seen 12/18/15, next appointment 04/05/16.

## 2016-03-19 MED ORDER — DAYTRANA 30 MG/9HR TD PTCH: 1.0000 | MEDICATED_PATCH | Freq: Every day | TRANSDERMAL | Status: DC

## 2016-03-19 NOTE — Telephone Encounter (Signed)
Printed Rx and placed at front desk for pick-up  

## 2016-04-05 ENCOUNTER — Encounter: Payer: Self-pay | Admitting: Pediatrics

## 2016-04-05 ENCOUNTER — Ambulatory Visit (INDEPENDENT_AMBULATORY_CARE_PROVIDER_SITE_OTHER): Payer: Managed Care, Other (non HMO) | Admitting: Pediatrics

## 2016-04-05 VITALS — BP 120/70 | Ht 69.25 in | Wt 132.6 lb

## 2016-04-05 DIAGNOSIS — R278 Other lack of coordination: Secondary | ICD-10-CM

## 2016-04-05 DIAGNOSIS — R488 Other symbolic dysfunctions: Secondary | ICD-10-CM | POA: Insufficient documentation

## 2016-04-05 DIAGNOSIS — F902 Attention-deficit hyperactivity disorder, combined type: Secondary | ICD-10-CM

## 2016-04-05 MED ORDER — DAYTRANA 30 MG/9HR TD PTCH: 1.0000 | MEDICATED_PATCH | Freq: Every day | TRANSDERMAL | Status: DC

## 2016-04-05 MED ORDER — BUSPIRONE HCL 5 MG PO TABS: 5.0000 mg | ORAL_TABLET | Freq: Two times a day (BID) | ORAL | Status: DC

## 2016-04-05 NOTE — Progress Notes (Signed)
[facility] [address] [place] [address] Dept: [phone] Dept Fax: [phone] Loc: [phone] Loc Fax: [phone]  Medical Follow-up  Patient ID: Veronica Rice, female  DOB: 1996/04/11, 20 y.o.  MRN: [id number]  Date of Evaluation: 04/05/16  PCP: Gweneth Dimitri, MD  Accompanied by: Mother Patient Lives with: college dorm  HISTORY/CURRENT STATUS:  HPI routine visit, medication check  EDUCATION: School: [employer] Year/Grade: 2nd year Homework Time: 4-5 hrs Performance/Grades: above averageA's,1 B Services: IEP/504 Plan, 'as you are' Activities/Exercise: works out when she can,   MEDICAL HISTORY: Appetite: fairly good, eating small amts during day, more at night MVI/Other: 0 Fruits/Vegs:good Calcium: 0 Iron:0  Sleep: Bedtime: 2am Awakens: 9am 3days. 2 days 10 am Sleep Concerns: Initiation/Maintenance/Other: sleeps well  Individual Medical History/Review of System Changes? No Review of Systems  Constitutional: Negative.   HENT: Negative.   Eyes: Negative.   Respiratory: Negative.   Cardiovascular: Negative.   Gastrointestinal: Negative.   Genitourinary: Negative.   Musculoskeletal: Negative.   Skin: Negative.   Neurological: Negative.   Endo/Heme/Allergies: Negative.   Psychiatric/Behavioral: Negative.    Allergies: Amoxicillin  Current Medications:  Current outpatient prescriptions:  .  ALBUTEROL IN, Inhale into the lungs as needed., Disp: , Rfl:  .  busPIRone (BUSPAR) 5 MG tablet, Take 1 tablet (5 mg total) by mouth 2 (two) times daily., Disp: 30 tablet, Rfl: 2 .  DAYTRANA 30 MG/9HR, Place 1 patch onto the skin daily. wear patch for 9 hours only each day, Disp: 30 patch, Rfl: 0 .  EPIDUO 0.1-2.5 % gel, daily., Disp: , Rfl: 0 .  norethindrone-ethinyl estradiol (BLISOVI FE 1/20) 1-20 MG-MCG tablet, Take 1  tablet by mouth daily., Disp: 3 Package, Rfl: 3 Medication Side Effects: Appetite Suppression and Sleep Problems  Family Medical/Social History Changes?: No  MENTAL HEALTH: Mental Health Issues: Anxiety  PHYSICAL EXAM: Vitals:  Today's Vitals   04/05/16 1512  BP: 120/70  Height: 5' 9.25" (1.759 m)  Weight: 132 lb 9.6 oz (60.147 kg)  , Facility age limit for growth percentiles is 20 years.  General Exam: Physical Exam  Constitutional: She is oriented to person, place, and time. She appears well-developed and well-nourished. No distress.  HENT:  Head: Normocephalic and atraumatic.  Right Ear: External ear normal.  Left Ear: External ear normal.  Nose: Nose normal.  Mouth/Throat: Oropharynx is clear and moist. No oropharyngeal exudate.  Eyes: Conjunctivae and EOM are normal. Pupils are equal, round, and reactive to light. Right eye exhibits no discharge. Left eye exhibits no discharge. No scleral icterus.  Neck: Normal range of motion. Neck supple. No JVD present. No tracheal deviation present. No thyromegaly present.  Cardiovascular: Normal rate, regular rhythm, normal heart sounds and intact distal pulses.  Exam reveals no gallop and no friction rub.   No murmur heard. Pulmonary/Chest: Effort normal and breath sounds normal. No stridor. No respiratory distress. She has no wheezes. She has no rales. She exhibits no tenderness.  Abdominal: Soft. Bowel sounds are normal. She exhibits no distension and no mass. There is no tenderness. There is no rebound and no guarding. No hernia.  Genitourinary:  deferred  Musculoskeletal: Normal range of motion. She exhibits no edema or tenderness.  Lymphadenopathy:    She has no cervical adenopathy.  Neurological: She is alert and oriented to person, place, and time. She has normal reflexes. She displays normal reflexes. No cranial nerve  deficit. She exhibits normal muscle tone. Coordination normal.  Skin: Skin is warm and dry. No rash noted. She  is not diaphoretic. No erythema. No pallor.  Psychiatric: She has a normal mood and affect. Her behavior is normal. Judgment and thought content normal.  Vitals reviewed.   Neurological: oriented to time, place, and person Cranial Nerves: normal  Neuromuscular:  Motor Mass: normal Tone: normal Strength: normal DTRs: 2+ and symmetric Overflow: mild Reflexes: no tremors noted, finger to nose without dysmetria bilaterally, performs thumb to finger exercise without difficulty, gait was normal and tandem gait was normal Sensory Exam: Vibratory: not done  Fine Touch: normal  Testing/Developmental Screens:  as/rs 16/9    DIAGNOSES:    ICD-9-CM ICD-10-CM   1. ADHD (attention deficit hyperactivity disorder), combined type 314.01 F90.2   2. Developmental dysgraphia 784.69 R48.8     RECOMMENDATIONS:  Patient Instructions  Continue daytrana patch daily-30 mg Trial buspar 5 mg , start with 1/2 tab at bedtime may increase to 1 tab 2 x day Watch for side effect such as dizziness , sleepiness    NEXT APPOINTMENT: Return in about 3 months (around 07/05/2016), or if symptoms worsen or fail to improve.   Nicholos JohnsJoyce P Antonette Hendricks, NP Counseling Time: 30 Total Contact Time: 50 More than 50% of visit was in counseling

## 2016-04-05 NOTE — Patient Instructions (Signed)
Continue daytrana patch daily-30 mg Trial buspar 5 mg , start with 1/2 tab at bedtime may increase to 1 tab 2 x day Watch for side effect such as dizziness , sleepiness

## 2016-04-07 ENCOUNTER — Telehealth: Payer: Self-pay | Admitting: Pediatrics

## 2016-04-07 NOTE — Telephone Encounter (Signed)
Received fax requesting prior authorization for Daytrana 30 mg.  Patient last seen 04/05/16.

## 2016-04-08 NOTE — Telephone Encounter (Signed)
Prior authorization form completed and faxed to [employer]. This is for Daytrana 30 mg patch dispensed 30, one every morning.

## 2016-04-14 ENCOUNTER — Telehealth: Payer: Self-pay | Admitting: Pediatrics

## 2016-04-14 NOTE — Telephone Encounter (Signed)
Received a fax from [employer]. This was a copy of a patient notification that was sent to the family. It stated that the prior authorization request for Daytrana was reviewed by [employer] and the pharmacy benefit manager, and the outcome was that the authorization was granted from 04/07/2016 through 04/07/2017.

## 2016-04-21 DIAGNOSIS — J45909 Unspecified asthma, uncomplicated: Secondary | ICD-10-CM | POA: Insufficient documentation

## 2016-08-08 IMAGING — CT CT HEAD W/O CM
1 series · 16 of 30 positions shown, 20 images · non-contrast
Comparison: None.

CLINICAL DATA: Left sided headache with blurred vision. Resolved
episode of left upper extremity numbness.

EXAM:
CT HEAD WITHOUT CONTRAST
TECHNIQUE: Contiguous axial images were obtained from the base of the skull
through the vertex without intravenous contrast.

[Series 2: head 4.8 h37s · axial · 0.45mm/px · z∈[-144,-11]mm · 16 of 32 slices shown, 20 images]
[im 2/32  brain]
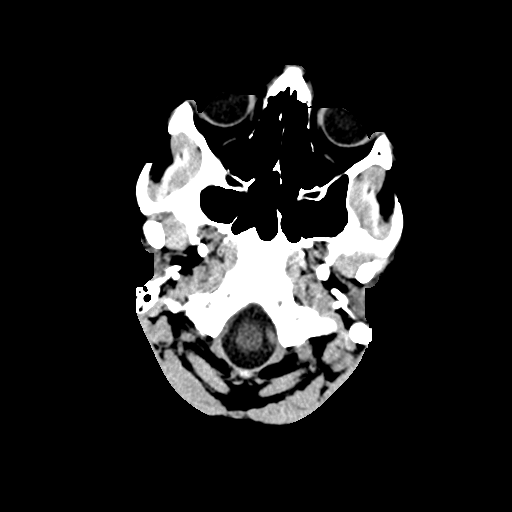
[im 2/32  bone]
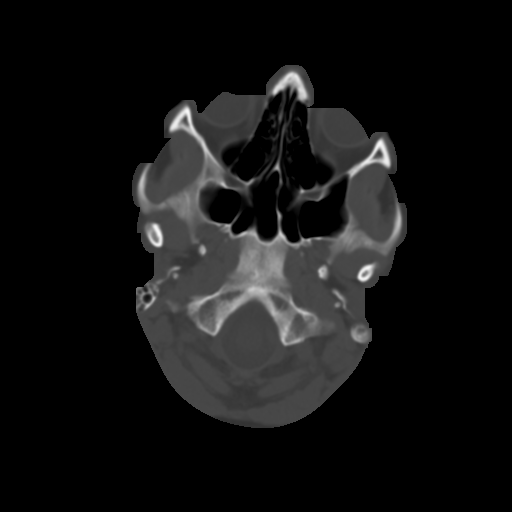
[im 4/32  brain]
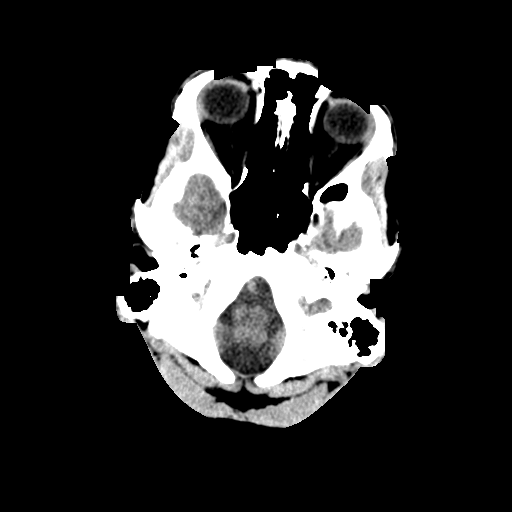
[im 6/32  brain]
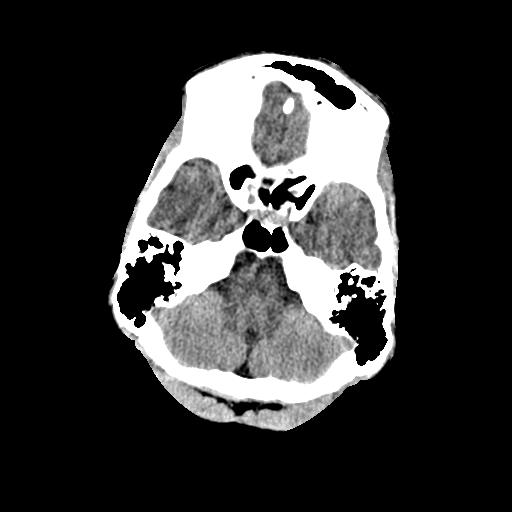
[im 8/32  brain]
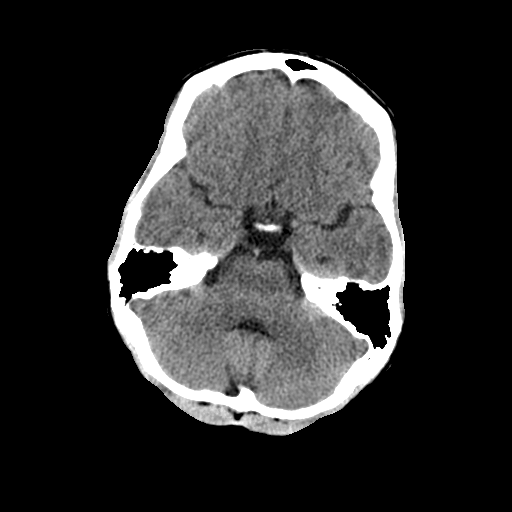
[im 9/32  brain]
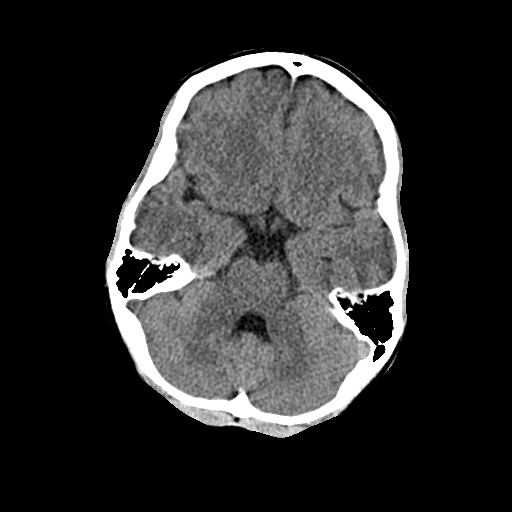
[im 9/32  bone]
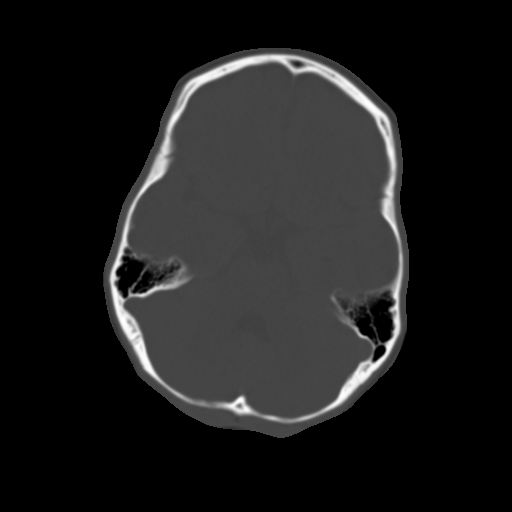
[im 11/32  brain]
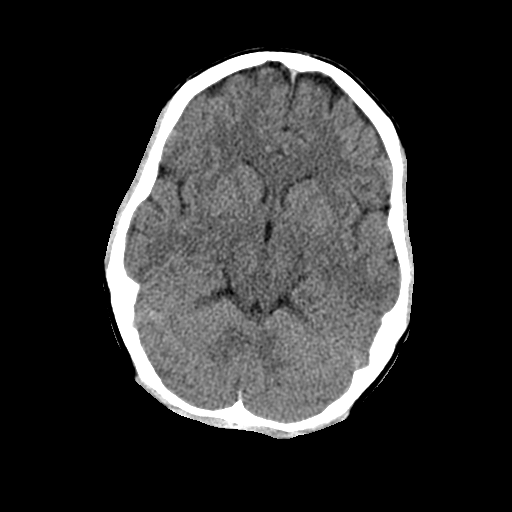
[im 13/32  brain]
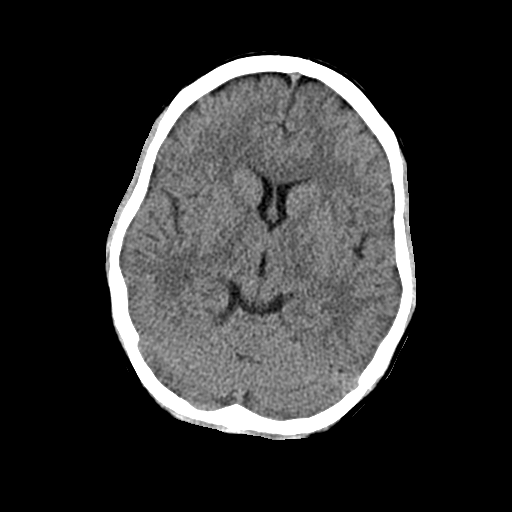
[im 15/32  brain]
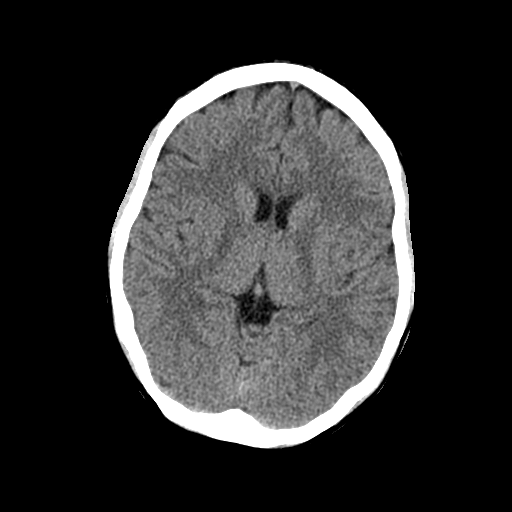
[im 17/32  brain]
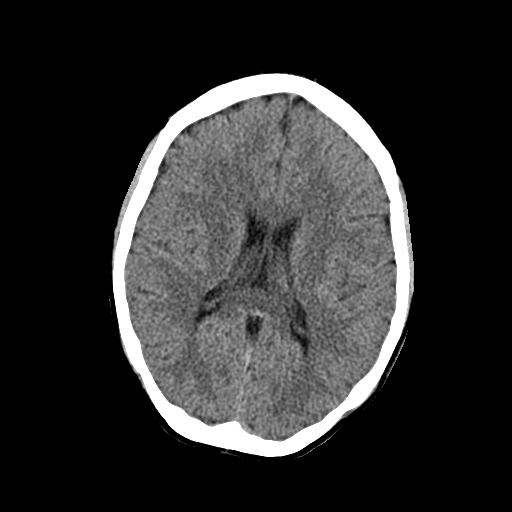
[im 17/32  bone]
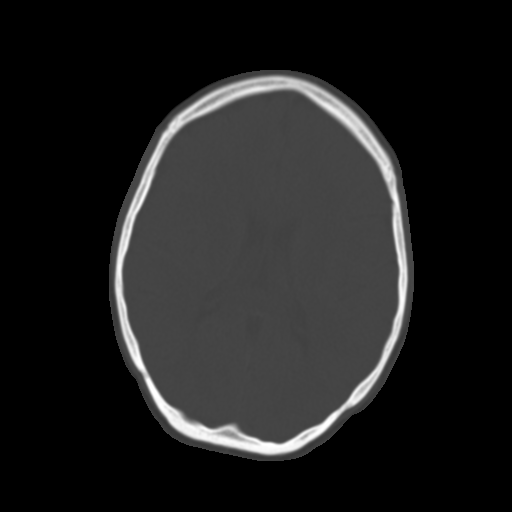
[im 19/32  brain]
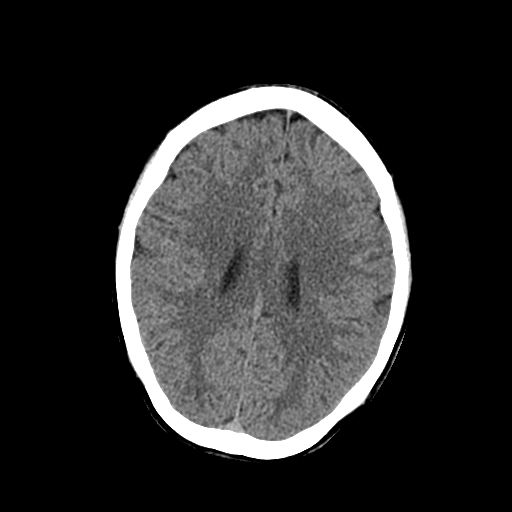
[im 21/32  brain]
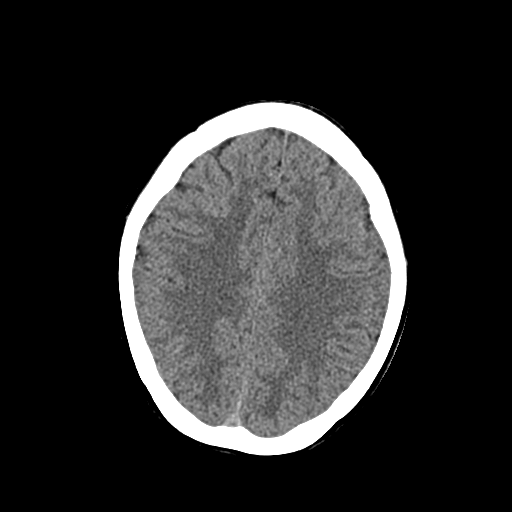
[im 23/32  brain]
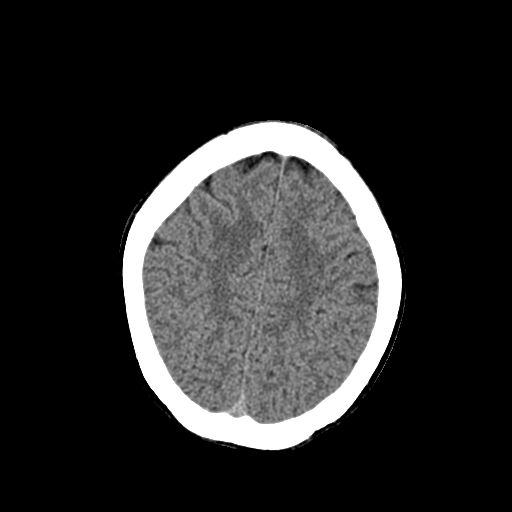
[im 24/32  brain]
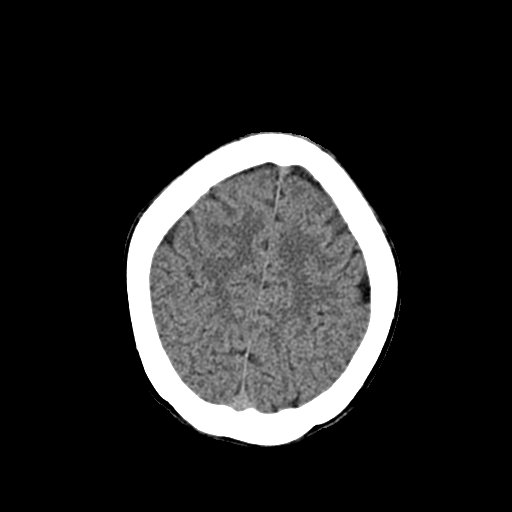
[im 24/32  bone]
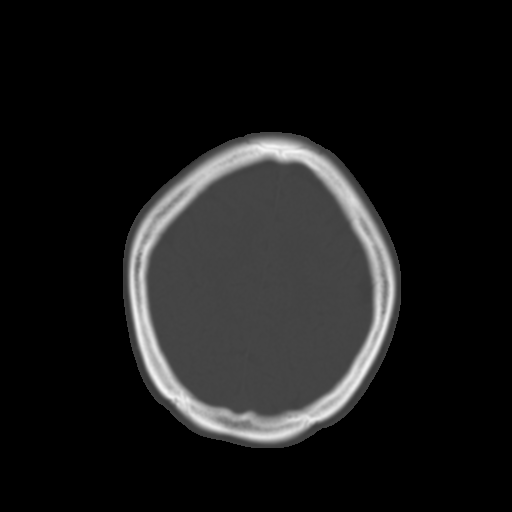
[im 26/32  brain]
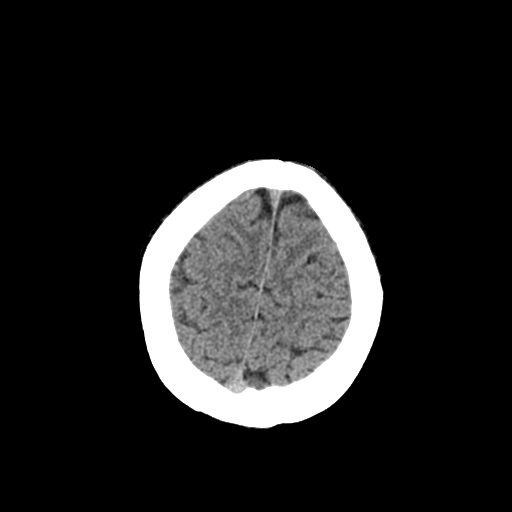
[im 28/32  brain]
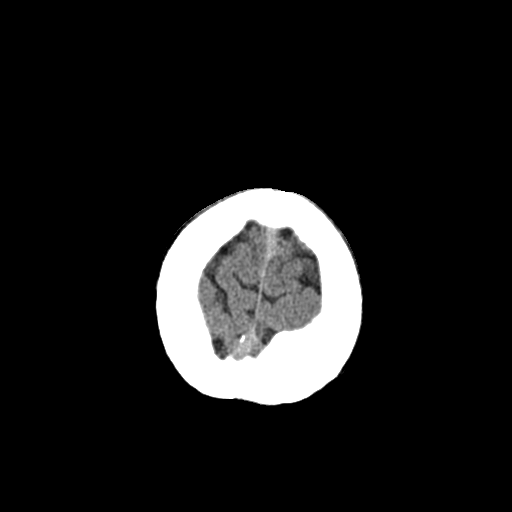
[im 30/32  brain]
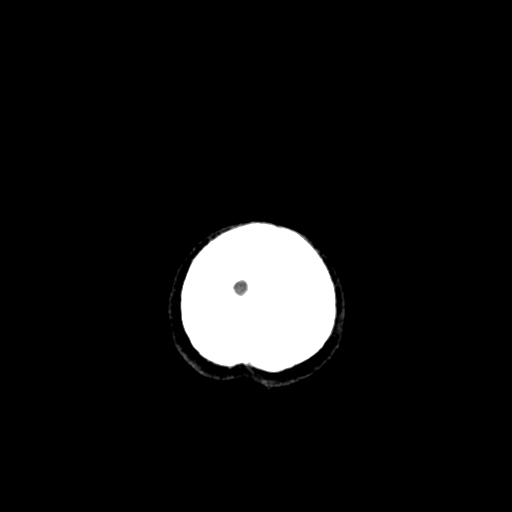

[16 of 30 positions shown; findings below may reference images not displayed]

FINDINGS: No evidence of parenchymal hemorrhage or extra-axial fluid
collection. No mass lesion, mass effect, or midline shift.

No CT evidence of acute infarction.

Cerebral volume is age appropriate. No ventriculomegaly.

The visualized paranasal sinuses are essentially clear. The mastoid
air cells are unopacified. No evidence of calvarial fracture.
IMPRESSION: No evidence of acute intracranial abnormality.

## 2016-09-30 ENCOUNTER — Ambulatory Visit (INDEPENDENT_AMBULATORY_CARE_PROVIDER_SITE_OTHER): Payer: Managed Care, Other (non HMO) | Admitting: Pediatrics

## 2016-09-30 ENCOUNTER — Encounter: Payer: Self-pay | Admitting: Pediatrics

## 2016-09-30 VITALS — BP 116/80 | Ht 69.25 in | Wt 132.4 lb

## 2016-09-30 DIAGNOSIS — R488 Other symbolic dysfunctions: Secondary | ICD-10-CM

## 2016-09-30 DIAGNOSIS — F902 Attention-deficit hyperactivity disorder, combined type: Secondary | ICD-10-CM

## 2016-09-30 DIAGNOSIS — R278 Other lack of coordination: Secondary | ICD-10-CM

## 2016-09-30 MED ORDER — DAYTRANA 30 MG/9HR TD PTCH: 1.0000 | MEDICATED_PATCH | Freq: Every day | TRANSDERMAL | 0 refills | Status: DC

## 2016-09-30 MED ORDER — BUSPIRONE HCL 5 MG PO TABS: 5.0000 mg | ORAL_TABLET | Freq: Two times a day (BID) | ORAL | 2 refills | Status: DC

## 2016-09-30 NOTE — Progress Notes (Signed)
[facility] [address] [place] [place] [address] Dept: [phone] Dept Fax: [phone] Loc: [phone] Loc Fax: [phone]  Medical Follow-up  Patient ID: Veronica Rice, female  DOB: 06-28-1996, 20 y.o.  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 No Review of Systems  Constitutional: Negative.  Negative for chills, diaphoresis, fever, malaise/fatigue and weight loss.  HENT: Negative.  Negative for congestion, ear discharge, ear pain, hearing loss, nosebleeds, sore throat and tinnitus.   Eyes: Negative.  Negative for blurred vision, double vision, photophobia, pain, discharge and redness.  Respiratory: Negative.  Negative for cough, hemoptysis, sputum production, shortness of breath, wheezing and stridor.   Cardiovascular: Negative.  Negative for chest pain, palpitations, orthopnea, claudication, leg swelling and PND.  Gastrointestinal: Negative.  Negative for abdominal pain, blood in stool, constipation, diarrhea, heartburn, melena, nausea and vomiting.  Genitourinary:  Negative.  Negative for dysuria, flank pain, frequency, hematuria and urgency.  Musculoskeletal: Negative.  Negative for back pain, falls, joint pain, myalgias and neck pain.  Skin: Negative.  Negative for itching and rash.  Neurological: Negative.  Negative for dizziness, tingling, tremors, sensory change, speech change, focal weakness, seizures, loss of consciousness, weakness and headaches.  Endo/Heme/Allergies: Negative.  Negative for environmental allergies and polydipsia. Does not bruise/bleed easily.  Psychiatric/Behavioral: Negative.  Negative for depression, hallucinations, memory loss, substance abuse and suicidal ideas. The patient is not nervous/anxious and does not have insomnia.    Allergies: Amoxicillin  Current Medications:  Current Outpatient Prescriptions:  .  ALBUTEROL IN, Inhale into the lungs as needed., Disp: , Rfl:  .  busPIRone (BUSPAR) 5 MG tablet, Take 1 tablet (5 mg total) by mouth 2 (two) times daily., Disp: 60 tablet, Rfl: 2 .  DAYTRANA 30 MG/9HR, Place 1 patch onto the skin daily. wear patch for 9 hours only each day, Disp: 30 patch, Rfl: 0 .  EPIDUO 0.1-2.5 % gel, daily., Disp: , Rfl: 0 .  norethindrone-ethinyl estradiol (BLISOVI FE 1/20) 1-20 MG-MCG tablet, Take 1 tablet by mouth daily., Disp: 3 Package, Rfl: 3 Medication Side Effects: None  Family Medical/Social History Changes?: No  MENTAL HEALTH: Mental Health Issues: good social skills  PHYSICAL EXAM: Vitals:  Today's Vitals   09/30/16 1603  BP: 116/80  Weight: 132 lb 6.4 oz (60.1 kg)  Height: 5' 9.25" (1.759 m)  PainSc: 0-No pain  , Facility age limit for growth percentiles is 20 years.  General Exam: Physical Exam  Constitutional: She is oriented to person, place, and time. She appears well-developed and well-nourished. No distress.  HENT:  Head: Normocephalic and atraumatic.  Right Ear: External ear normal.  Left Ear: External ear normal.  Nose: Nose normal.  Mouth/Throat: Oropharynx is  clear and moist. No oropharyngeal exudate.  Eyes: Conjunctivae and EOM are normal.  Pupils are equal, round, and reactive to light. Right eye exhibits no discharge. Left eye exhibits no discharge. No scleral icterus.  Neck: Normal range of motion. Neck supple. No JVD present. No tracheal deviation present. No thyromegaly present.  Cardiovascular: Normal rate, regular rhythm, normal heart sounds and intact distal pulses.  Exam reveals no gallop and no friction rub.   No murmur heard. Pulmonary/Chest: Effort normal and breath sounds normal. No stridor. No respiratory distress. She has no wheezes. She has no rales. She exhibits no tenderness.  Abdominal: Soft. Bowel sounds are normal. She exhibits no distension and no mass. There is no tenderness. There is no rebound and no guarding. No hernia.  Musculoskeletal: Normal range of motion. She exhibits no edema, tenderness or deformity.  Lymphadenopathy:    She has no cervical adenopathy.  Neurological: She is alert and oriented to person, place, and time. She has normal reflexes. She displays normal reflexes. No cranial nerve deficit. She exhibits normal muscle tone. Coordination normal.  Skin: Skin is warm and dry. No rash noted. She is not diaphoretic. No erythema. No pallor.  Psychiatric: She has a normal mood and affect. Her behavior is normal. Judgment and thought content normal.  Vitals reviewed.   Neurological: oriented to time, place, and person Cranial Nerves: normal  Neuromuscular:  Motor Mass: normal Tone: normal Strength: normal DTRs: normal 2+ and symmetric Overflow: mild Reflexes: no tremors noted, finger to nose without dysmetria bilaterally, performs thumb to finger exercise without difficulty, gait was normal and tandem gait was normal Sensory Exam: Vibratory: not done  Fine Touch: normal  Testing/Developmental Screens:  AS/RS 13/5  DIAGNOSES:    ICD-9-CM ICD-10-CM   1. Developmental dysgraphia 784.69 R48.8   2. ADHD  (attention deficit hyperactivity disorder), combined type 314.01 F90.2     RECOMMENDATIONS:  Patient Instructions  Continue Daytrana patch 30 mg every morning Continue buspar 5 mg 1-2 times daily  discussed growth and development-has matured a lot Discussed school progress-doing well  NEXT APPOINTMENT: Return in about 3 months (around 12/31/2016), or if symptoms worsen or fail to improve.   Nicholos JohnsJoyce P Adrain Butrick, NP Counseling Time: 30 Total Contact Time: 50 More than 50% of the visit involved counseling, discussing the diagnosis and management of symptoms with the patient and family

## 2016-09-30 NOTE — Patient Instructions (Signed)
Continue Daytrana patch 30 mg every morning Continue buspar 5 mg 1-2 times daily

## 2016-10-18 ENCOUNTER — Other Ambulatory Visit: Payer: Self-pay | Admitting: Pediatrics

## 2016-10-18 MED ORDER — BUSPIRONE HCL 5 MG PO TABS: 5.0000 mg | ORAL_TABLET | Freq: Two times a day (BID) | ORAL | 0 refills | Status: DC

## 2016-10-18 NOTE — Telephone Encounter (Signed)
E-scribed 90 day supply to [facility] [employer] [facility].

## 2016-10-18 NOTE — Telephone Encounter (Signed)
Received fax from [facility] requesting 90-day supply for Buspirone 5 mg.  Patient last seen 09/30/16, next appointment 12/30/16.

## 2016-12-06 DIAGNOSIS — K2100 Gastro-esophageal reflux disease with esophagitis, without bleeding: Secondary | ICD-10-CM | POA: Insufficient documentation

## 2016-12-10 ENCOUNTER — Other Ambulatory Visit: Payer: Self-pay | Admitting: Nurse Practitioner

## 2016-12-10 NOTE — Telephone Encounter (Signed)
Medication refill request: Norethindrone-Ethinyl Estradiol Last AEX:  12/29/15 PG Next AEX: 12/29/16 PG Last MMG (if hormonal medication request): n/a Refill authorized: 03/03/16 #3 Package 3R. Please advise. Thank you.

## 2016-12-29 ENCOUNTER — Encounter: Payer: Self-pay | Admitting: Nurse Practitioner

## 2016-12-29 ENCOUNTER — Ambulatory Visit (INDEPENDENT_AMBULATORY_CARE_PROVIDER_SITE_OTHER): Payer: Managed Care, Other (non HMO) | Admitting: Nurse Practitioner

## 2016-12-29 VITALS — BP 118/72 | HR 72 | Ht 70.0 in | Wt 135.0 lb

## 2016-12-29 DIAGNOSIS — Z113 Encounter for screening for infections with a predominantly sexual mode of transmission: Secondary | ICD-10-CM

## 2016-12-29 DIAGNOSIS — Z01419 Encounter for gynecological examination (general) (routine) without abnormal findings: Secondary | ICD-10-CM

## 2016-12-29 DIAGNOSIS — Z0000 Encounter for general adult medical examination without abnormal findings: Secondary | ICD-10-CM

## 2016-12-29 LAB — POCT URINALYSIS DIPSTICK
BILIRUBIN UA: NEGATIVE
GLUCOSE UA: NEGATIVE
Ketones, UA: NEGATIVE
NITRITE UA: NEGATIVE
PH UA: 7
Protein, UA: NEGATIVE
Urobilinogen, UA: NEGATIVE

## 2016-12-29 MED ORDER — NORETHINDRONE ACET-ETHINYL EST 1.5-30 MG-MCG PO TABS: 1.0000 | ORAL_TABLET | Freq: Every day | ORAL | 4 refills | Status: DC

## 2016-12-29 NOTE — Patient Instructions (Signed)
General topics  Next pap or exam is  due in 1 year Take a Women's multivitamin Take 1200 mg. of calcium daily - prefer dietary If any concerns in interim to call back  Breast Self-Awareness Practicing breast self-awareness may pick up problems early, prevent significant medical complications, and possibly save your life. By practicing breast self-awareness, you can become familiar with how your breasts look and feel and if your breasts are changing. This allows you to notice changes early. It can also offer you some reassurance that your breast health is good. One way to learn what is normal for your breasts and whether your breasts are changing is to do a breast self-exam. If you find a lump or something that was not present in the past, it is best to contact your caregiver right away. Other findings that should be evaluated by your caregiver include nipple discharge, especially if it is bloody; skin changes or reddening; areas where the skin seems to be pulled in (retracted); or new lumps and bumps. Breast pain is seldom associated with cancer (malignancy), but should also be evaluated by a caregiver. BREAST SELF-EXAM The best time to examine your breasts is 5 7 days after your menstrual period is over.  ExitCare Patient Information 2013 ExitCare, LLC.   Exercise to Stay Healthy Exercise helps you become and stay healthy. EXERCISE IDEAS AND TIPS Choose exercises that:  You enjoy.  Fit into your day. You do not need to exercise really hard to be healthy. You can do exercises at a slow or medium level and stay healthy. You can:  Stretch before and after working out.  Try yoga, Pilates, or tai chi.  Lift weights.  Walk fast, swim, jog, run, climb stairs, bicycle, dance, or rollerskate.  Take aerobic classes. Exercises that burn about 150 calories:  Running 1  miles in 15 minutes.  Playing volleyball for 45 to 60 minutes.  Washing and waxing a car for 45 to 60  minutes.  Playing touch football for 45 minutes.  Walking 1  miles in 35 minutes.  Pushing a stroller 1  miles in 30 minutes.  Playing basketball for 30 minutes.  Raking leaves for 30 minutes.  Bicycling 5 miles in 30 minutes.  Walking 2 miles in 30 minutes.  Dancing for 30 minutes.  Shoveling snow for 15 minutes.  Swimming laps for 20 minutes.  Walking up stairs for 15 minutes.  Bicycling 4 miles in 15 minutes.  Gardening for 30 to 45 minutes.  Jumping rope for 15 minutes.  Washing windows or floors for 45 to 60 minutes. Document Released: 01/08/2011 Document Revised: 02/28/2012 Document Reviewed: 01/08/2011 ExitCare Patient Information 2013 ExitCare, LLC.   Other topics ( that may be useful information):    Sexually Transmitted Disease Sexually transmitted disease (STD) refers to any infection that is passed from person to person during sexual activity. This may happen by way of saliva, semen, blood, vaginal mucus, or urine. Common STDs include:  Gonorrhea.  Chlamydia.  Syphilis.  HIV/AIDS.  Genital herpes.  Hepatitis B and C.  Trichomonas.  Human papillomavirus (HPV).  Pubic lice. CAUSES  An STD may be spread by bacteria, virus, or parasite. A person can get an STD by:  Sexual intercourse with an infected person.  Sharing sex toys with an infected person.  Sharing needles with an infected person.  Having intimate contact with the genitals, mouth, or rectal areas of an infected person. SYMPTOMS  Some people may not have any symptoms, but   they can still pass the infection to others. Different STDs have different symptoms. Symptoms include:  Painful or bloody urination.  Pain in the pelvis, abdomen, vagina, anus, throat, or eyes.  Skin rash, itching, irritation, growths, or sores (lesions). These usually occur in the genital or anal area.  Abnormal vaginal discharge.  Penile discharge in men.  Soft, flesh-colored skin growths in the  genital or anal area.  Fever.  Pain or bleeding during sexual intercourse.  Swollen glands in the groin area.  Yellow skin and eyes (jaundice). This is seen with hepatitis. DIAGNOSIS  To make a diagnosis, your caregiver may:  Take a medical history.  Perform a physical exam.  Take a specimen (culture) to be examined.  Examine a sample of discharge under a microscope.  Perform blood test TREATMENT   Chlamydia, gonorrhea, trichomonas, and syphilis can be cured with antibiotic medicine.  Genital herpes, hepatitis, and HIV can be treated, but not cured, with prescribed medicines. The medicines will lessen the symptoms.  Genital warts from HPV can be treated with medicine or by freezing, burning (electrocautery), or surgery. Warts may come back.  HPV is a virus and cannot be cured with medicine or surgery.However, abnormal areas may be followed very closely by your caregiver and may be removed from the cervix, vagina, or vulva through office procedures or surgery. If your diagnosis is confirmed, your recent sexual partners need treatment. This is true even if they are symptom-free or have a negative culture or evaluation. They should not have sex until their caregiver says it is okay. HOME CARE INSTRUCTIONS  All sexual partners should be informed, tested, and treated for all STDs.  Take your antibiotics as directed. Finish them even if you start to feel better.  Only take over-the-counter or prescription medicines for pain, discomfort, or fever as directed by your caregiver.  Rest.  Eat a balanced diet and drink enough fluids to keep your urine clear or pale yellow.  Do not have sex until treatment is completed and you have followed up with your caregiver. STDs should be checked after treatment.  Keep all follow-up appointments, Pap tests, and blood tests as directed by your caregiver.  Only use latex condoms and water-soluble lubricants during sexual activity. Do not use  petroleum jelly or oils.  Avoid alcohol and illegal drugs.  Get vaccinated for HPV and hepatitis. If you have not received these vaccines in the past, talk to your caregiver about whether one or both might be right for you.  Avoid risky sex practices that can break the skin. The only way to avoid getting an STD is to avoid all sexual activity.Latex condoms and dental dams (for oral sex) will help lessen the risk of getting an STD, but will not completely eliminate the risk. SEEK MEDICAL CARE IF:   You have a fever.  You have any new or worsening symptoms. Document Released: 02/26/2003 Document Revised: 02/28/2012 Document Reviewed: 03/05/2011 [facility] Patient Information 2013 [employer].    Domestic Abuse You are being battered or abused if someone close to you hits, pushes, or physically hurts you in any way. You also are being abused if you are forced into activities. You are being sexually abused if you are forced to have sexual contact of any kind. You are being emotionally abused if you are made to feel worthless or if you are constantly threatened. It is important to remember that help is available. No one has the right to abuse you. PREVENTION OF FURTHER  ABUSE  Learn the warning signs of danger. This varies with situations but may include: the use of alcohol, threats, isolation from friends and family, or forced sexual contact. Leave if you feel that violence is going to occur.  If you are attacked or beaten, report it to the police so the abuse is documented. You do not have to press charges. The police can protect you while you or the attackers are leaving. Get the officer's name and badge number and a copy of the report.  Find someone you can trust and tell them what is happening to you: your caregiver, a nurse, clergy member, close friend or family member. Feeling ashamed is natural, but remember that you have done nothing wrong. No one deserves abuse. Document Released:  12/03/2000 Document Revised: 02/28/2012 Document Reviewed: 02/11/2011 ExitCare Patient Information 2013 ExitCare, LLC.    How Much is Too Much Alcohol? Drinking too much alcohol can cause injury, accidents, and health problems. These types of problems can include:   Car crashes.  Falls.  Family fighting (domestic violence).  Drowning.  Fights.  Injuries.  Burns.  Damage to certain organs.  Having a baby with birth defects. ONE DRINK CAN BE TOO MUCH WHEN YOU ARE:  Working.  Pregnant or breastfeeding.  Taking medicines. Ask your doctor.  Driving or planning to drive. If you or someone you know has a drinking problem, get help from a doctor.  Document Released: 10/02/2009 Document Revised: 02/28/2012 Document Reviewed: 10/02/2009 ExitCare Patient Information 2013 ExitCare, LLC.   Smoking Hazards Smoking cigarettes is extremely bad for your health. Tobacco smoke has over 200 known poisons in it. There are over 60 chemicals in tobacco smoke that cause cancer. Some of the chemicals found in cigarette smoke include:   Cyanide.  Benzene.  Formaldehyde.  Methanol (wood alcohol).  Acetylene (fuel used in welding torches).  Ammonia. Cigarette smoke also contains the poisonous gases nitrogen oxide and carbon monoxide.  Cigarette smokers have an increased risk of many serious medical problems and Smoking causes approximately:  90% of all lung cancer deaths in men.  80% of all lung cancer deaths in women.  90% of deaths from chronic obstructive lung disease. Compared with nonsmokers, smoking increases the risk of:  Coronary heart disease by 2 to 4 times.  Stroke by 2 to 4 times.  Men developing lung cancer by 23 times.  Women developing lung cancer by 13 times.  Dying from chronic obstructive lung diseases by 12 times.  . Smoking is the most preventable cause of death and disease in our society.  WHY IS SMOKING ADDICTIVE?  Nicotine is the chemical  agent in tobacco that is capable of causing addiction or dependence.  When you smoke and inhale, nicotine is absorbed rapidly into the bloodstream through your lungs. Nicotine absorbed through the lungs is capable of creating a powerful addiction. Both inhaled and non-inhaled nicotine may be addictive.  Addiction studies of cigarettes and spit tobacco show that addiction to nicotine occurs mainly during the teen years, when young people begin using tobacco products. WHAT ARE THE BENEFITS OF QUITTING?  There are many health benefits to quitting smoking.   Likelihood of developing cancer and heart disease decreases. Health improvements are seen almost immediately.  Blood pressure, pulse rate, and breathing patterns start returning to normal soon after quitting. QUITTING SMOKING   American Lung Association - 1-800-LUNGUSA  American Cancer Society - 1-800-ACS-2345 Document Released: 01/13/2005 Document Revised: 02/28/2012 Document Reviewed: 09/17/2009 ExitCare Patient Information 2013 ExitCare,   LLC.   Stress Management Stress is a state of physical or mental tension that often results from changes in your life or normal routine. Some common causes of stress are:  Death of a loved one.  Injuries or severe illnesses.  Getting fired or changing jobs.  Moving into a new home. Other causes may be:  Sexual problems.  Business or financial losses.  Taking on a large debt.  Regular conflict with someone at home or at work.  Constant tiredness from lack of sleep. It is not just bad things that are stressful. It may be stressful to:  Win the lottery.  Get married.  Buy a new car. The amount of stress that can be easily tolerated varies from person to person. Changes generally cause stress, regardless of the types of change. Too much stress can affect your health. It may lead to physical or emotional problems. Too little stress (boredom) may also become stressful. SUGGESTIONS TO  REDUCE STRESS:  Talk things over with your family and friends. It often is helpful to share your concerns and worries. If you feel your problem is serious, you may want to get help from a professional counselor.  Consider your problems one at a time instead of lumping them all together. Trying to take care of everything at once may seem impossible. List all the things you need to do and then start with the most important one. Set a goal to accomplish 2 or 3 things each day. If you expect to do too many in a single day you will naturally fail, causing you to feel even more stressed.  Do not use alcohol or drugs to relieve stress. Although you may feel better for a short time, they do not remove the problems that caused the stress. They can also be habit forming.  Exercise regularly - at least 3 times per week. Physical exercise can help to relieve that "uptight" feeling and will relax you.  The shortest distance between despair and hope is often a good night's sleep.  Go to bed and get up on time allowing yourself time for appointments without being rushed.  Take a short "time-out" period from any stressful situation that occurs during the day. Close your eyes and take some deep breaths. Starting with the muscles in your face, tense them, hold it for a few seconds, then relax. Repeat this with the muscles in your neck, shoulders, hand, stomach, back and legs.  Take good care of yourself. Eat a balanced diet and get plenty of rest.  Schedule time for having fun. Take a break from your daily routine to relax. HOME CARE INSTRUCTIONS   Call if you feel overwhelmed by your problems and feel you can no longer manage them on your own.  Return immediately if you feel like hurting yourself or someone else. Document Released: 06/01/2001 Document Revised: 02/28/2012 Document Reviewed: 01/22/2008 ExitCare Patient Information 2013 ExitCare, LLC.  

## 2016-12-29 NOTE — Progress Notes (Signed)
Patient ID: Veronica Rice, female   DOB: [birth year], 21 y.o.   MRN: [id number]  21 y.o. G0P0000 Single  Caucasian Fe here for annual exam.  She feels well.  Menses at 3-4 days.  Having BTB mostly at mid cycle. She is now not dating or SA.  Former partner was unfaithful.  She desires STD's.  Patient's last menstrual period was 12/28/2016 (exact date).          Sexually active: Yes.    The current method of family planning is OCP (estrogen/progesterone) and condoms sometimes.    Exercising: Yes.    walking, cardio and lifting Smoker:  no  Health Maintenance: Pap:  Never per guidelines TDaP:  2015 HIV: 12/26/14 Labs: HB: 14.6  Urine: Large RBC, trace WBC (on menses)   reports that she has never smoked. She has never used smokeless tobacco. She reports that she drinks about 0.6 oz of alcohol per week . She reports that she does not use drugs.  Past Medical History:  Diagnosis Date  . ADD (attention deficit disorder) since age 31-8   mild hyperactitivy aspect  . Asthma    Sports Induced    Past Surgical History:  Procedure Laterality Date  . APPENDECTOMY  2011  . NOSE SURGERY  05/2015   turbinate reduction and fracture correction  . WISDOM TOOTH EXTRACTION  2013    Current Outpatient Prescriptions  Medication Sig Dispense Refill  . ALBUTEROL IN Inhale into the lungs as needed.    . busPIRone (BUSPAR) 5 MG tablet Take 1 tablet (5 mg total) by mouth 2 (two) times daily. 180 tablet 0  . DAYTRANA 30 MG/9HR Place 1 patch onto the skin daily. wear patch for 9 hours only each day 30 patch 0  . omeprazole (PRILOSEC) 40 MG capsule Take 40 mg by mouth daily.  3  . Norethindrone Acetate-Ethinyl Estradiol (LOESTRIN 1.5/30, 21,) 1.5-30 MG-MCG tablet Take 1 tablet by mouth daily. 3 Package 4   No current facility-administered medications for this visit.     Family History  Problem Relation Age of Onset  . Thyroid disease Father     Hypothyroidism  . Breast cancer Maternal Grandmother 60   . Anxiety disorder Maternal Grandmother   . Hypertension Maternal Grandfather   . Heart disease Maternal Grandfather   . Heart disease Paternal Grandfather   . Colon cancer Paternal Grandfather   . Parkinson's disease Paternal Grandfather     ROS:  Pertinent items are noted in HPI.  Otherwise, a comprehensive ROS was negative.  Exam:   BP 118/72 (BP Location: Right Arm, Patient Position: Sitting, Cuff Size: Normal)   Pulse 72   Ht 5\' 10"  (1.778 m)   Wt 135 lb (61.2 kg)   LMP 12/28/2016 (Exact Date)   BMI 19.37 kg/m  Height: 5\' 10"  (177.8 cm) Ht Readings from Last 3 Encounters:  12/29/16 5\' 10"  (1.778 m)  12/29/15 5' 9.5" (1.765 m)  10/26/15 5' 10.5" (1.791 m) (>99 %, Z > 2.33)*   * Growth percentiles are based on CDC 2-20 Years data.    General appearance: alert, cooperative and appears stated age Head: Normocephalic, without obvious abnormality, atraumatic Neck: no adenopathy, supple, symmetrical, trachea midline and thyroid normal to inspection and palpation Lungs: clear to auscultation bilaterally Breasts: normal appearance, no masses or tenderness Heart: regular rate and rhythm Abdomen: soft, non-tender; no masses,  no organomegaly Extremities: extremities normal, atraumatic, no cyanosis or edema Skin: Skin color, texture, turgor normal. No rashes or  lesions Lymph nodes: Cervical, supraclavicular, and axillary nodes normal. No abnormal inguinal nodes palpated Neurologic: Grossly normal   Pelvic: External genitalia:  no lesions              Urethra:  normal appearing urethra with no masses, tenderness or lesions              Bartholin's and Skene's: normal                 Vagina: normal appearing vagina with normal color and moderate menses flow, no lesions              Cervix: anteverted              Pap taken: Yes.   Bimanual Exam:  Uterus:  normal size, contour, position, consistency, mobility, non-tender              Adnexa: no mass, fullness, tenderness                Rectovaginal: Confirms               Anus:  normal sphincter tone, no lesions  Chaperone present: yes  A:  Well Woman with normal exam    Contraception with OCP - BTB at mid cycle that has been ongoing. History of ADD R/O STD's   P:   Reviewed health and wellness pertinent to exam  Pap smear was done  Will try changing OCP to Loestrin 1.5/30 to see if BTB is better.  To CB if still persist.  Follow with labs  Counseled on breast self exam, STD prevention, HIV risk factors and prevention, use and side effects of OCP's, adequate intake of calcium and vitamin D, diet and exercise return annually or prn  An After Visit Summary was printed and given to the patient.

## 2016-12-30 ENCOUNTER — Ambulatory Visit (INDEPENDENT_AMBULATORY_CARE_PROVIDER_SITE_OTHER): Payer: Managed Care, Other (non HMO) | Admitting: Pediatrics

## 2016-12-30 ENCOUNTER — Encounter: Payer: Self-pay | Admitting: Pediatrics

## 2016-12-30 VITALS — BP 96/70 | Ht 69.25 in | Wt 138.0 lb

## 2016-12-30 DIAGNOSIS — F902 Attention-deficit hyperactivity disorder, combined type: Secondary | ICD-10-CM | POA: Diagnosis not present

## 2016-12-30 DIAGNOSIS — R488 Other symbolic dysfunctions: Secondary | ICD-10-CM

## 2016-12-30 DIAGNOSIS — R278 Other lack of coordination: Secondary | ICD-10-CM

## 2016-12-30 LAB — HEMOGLOBIN, FINGERSTICK: HEMOGLOBIN, FINGERSTICK: 14.6 g/dL (ref 12.0–15.0)

## 2016-12-30 LAB — STD PANEL
HIV: NONREACTIVE
Hepatitis B Surface Ag: NEGATIVE

## 2016-12-30 LAB — IPS PAP SMEAR ONLY

## 2016-12-30 LAB — GC/CHLAMYDIA PROBE AMP
CT PROBE, AMP APTIMA: NOT DETECTED
GC Probe RNA: NOT DETECTED

## 2016-12-30 MED ORDER — DAYTRANA 30 MG/9HR TD PTCH: 1.0000 | MEDICATED_PATCH | Freq: Every day | TRANSDERMAL | 0 refills | Status: DC

## 2016-12-30 MED ORDER — BUSPIRONE HCL 5 MG PO TABS: 5.0000 mg | ORAL_TABLET | Freq: Two times a day (BID) | ORAL | 0 refills | Status: DC

## 2016-12-30 NOTE — Patient Instructions (Signed)
Continue  daytrana patch 30 mg every morning  buspar 5 mg 1-2 times daily

## 2016-12-30 NOTE — Progress Notes (Signed)
[facility] [address] [place] [address] Dept: [phone] Dept Fax: [phone] Loc: [phone] Loc Fax: [phone]  Medical Follow-up  Patient ID: Veronica HurlElizabeth G Zuckerman, female  DOB: 10/26/1996, 21 y.o.  MRN: [id number]  Date of Evaluation: 12/30/16  PCP: Gweneth DimitriMCNEILL,WENDY, MD  Accompanied by: Mother Patient Lives with: in college  HISTORY/CURRENT STATUS:  HPI  Routine visit, medication check Still struggles at school. Has to take a class over because she didn't hear that the exam was rescheduled and missed it Hasn't been using medication as she should, no buspar and only uses daytrana on test days.   EDUCATION: School: [employer] Year/Grade: 2nd year Homework Time: 3-4 yrs Performance/Grades: average Services: IEP/504 Plan Activities/Exercise: walks  MEDICAL HISTORY: Appetite: ok when not on medication MVI/Other: none Fruits/Vegs:does well Calcium: some yogurt Iron:some meats  Sleep: Bedtime: 12-1 Awakens: 7-8 Sleep Concerns: Initiation/Maintenance/Other: difficulty with initiation  Individual Medical History/Review of System Changes? No Review of Systems  Constitutional: Negative.  Negative for chills, diaphoresis, fever, malaise/fatigue and weight loss.  HENT: Negative.  Negative for congestion, ear discharge, ear pain, hearing loss, nosebleeds, sinus pain, sore throat and tinnitus.   Eyes: Negative.  Negative for blurred vision, double vision, photophobia, pain, discharge and redness.  Respiratory: Negative.  Negative for cough, hemoptysis, sputum production, shortness of breath, wheezing and stridor.   Cardiovascular: Negative.  Negative for chest pain, palpitations, orthopnea, claudication, leg swelling and PND.  Gastrointestinal: Negative.  Negative for abdominal pain, blood in stool, constipation, diarrhea, heartburn,  melena, nausea and vomiting.  Genitourinary: Negative.  Negative for dysuria, flank pain, frequency, hematuria and urgency.  Musculoskeletal: Negative.  Negative for back pain, falls, joint pain, myalgias and neck pain.  Skin: Negative.  Negative for itching and rash.  Neurological: Negative.  Negative for dizziness, tingling, tremors, sensory change, speech change, focal weakness, seizures, loss of consciousness, weakness and headaches.  Endo/Heme/Allergies: Negative.  Negative for environmental allergies and polydipsia. Does not bruise/bleed easily.  Psychiatric/Behavioral: Negative.  Negative for depression, hallucinations, memory loss, substance abuse and suicidal ideas. The patient is not nervous/anxious and does not have insomnia.     Allergies: Amoxicillin  Current Medications:  Current Outpatient Prescriptions:  .  ALBUTEROL IN, Inhale into the lungs as needed., Disp: , Rfl:  .  busPIRone (BUSPAR) 5 MG tablet, Take 1 tablet (5 mg total) by mouth 2 (two) times daily., Disp: 60 tablet, Rfl: 0 .  DAYTRANA 30 MG/9HR, Place 1 patch onto the skin daily. wear patch for 9 hours only each day, Disp: 30 patch, Rfl: 0 .  Norethindrone Acetate-Ethinyl Estradiol (LOESTRIN 1.5/30, 21,) 1.5-30 MG-MCG tablet, Take 1 tablet by mouth daily., Disp: 3 Package, Rfl: 4 .  omeprazole (PRILOSEC) 40 MG capsule, Take 40 mg by mouth daily., Disp: , Rfl: 3 Medication Side Effects: Appetite Suppression  Family Medical/Social History Changes?: No  MENTAL HEALTH: Mental Health Issues: good social skills  PHYSICAL EXAM: Vitals:  Today's Vitals   12/30/16 1012  BP: 96/70  Weight: 138 lb (62.6 kg)  Height: 5' 9.25" (1.759 m)  PainSc: 0-No pain  , Facility age limit for growth percentiles is 20 years.  General Exam: Physical Exam  Constitutional: She is oriented to person, place, and time. She appears well-developed and well-nourished. No distress.  HENT:  Head: Normocephalic and atraumatic.  Right Ear:  External ear normal.  Left Ear: External ear normal.  Nose: Nose  normal.  Mouth/Throat: Oropharynx is clear and moist. No oropharyngeal exudate.  Eyes: Conjunctivae and EOM are normal. Pupils are equal, round, and reactive to light. Right eye exhibits no discharge. Left eye exhibits no discharge. No scleral icterus.  Neck: Normal range of motion. Neck supple. No JVD present. No tracheal deviation present. No thyromegaly present.  Cardiovascular: Normal rate, regular rhythm, normal heart sounds and intact distal pulses.  Exam reveals no gallop and no friction rub.   No murmur heard. Pulmonary/Chest: Effort normal and breath sounds normal. No stridor. No respiratory distress. She has no wheezes. She has no rales. She exhibits no tenderness.  Abdominal: Soft. Bowel sounds are normal. She exhibits no distension and no mass. There is no tenderness. There is no rebound and no guarding. No hernia.  Musculoskeletal: Normal range of motion. She exhibits no edema, tenderness or deformity.  Lymphadenopathy:    She has no cervical adenopathy.  Neurological: She is alert and oriented to person, place, and time. She has normal reflexes. She displays normal reflexes. No cranial nerve deficit or sensory deficit. She exhibits normal muscle tone. Coordination normal.  Skin: Skin is warm and dry. No rash noted. She is not diaphoretic. No erythema. No pallor.  Psychiatric: Her behavior is normal. Judgment and thought content normal.  Difficulty initiating sleep-has not used buspar in pm, unable to shut her brain down   Vitals reviewed.   Neurological: oriented to time, place, and person Cranial Nerves: normal  Neuromuscular:  Motor Mass: normal Tone: normal Strength: normal DTRs: normal 2+ and symmetric Overflow: mild Reflexes: no tremors noted, finger to nose without dysmetria bilaterally, performs thumb to finger exercise without difficulty, gait was normal and tandem gait was normal Sensory Exam:  Vibratory: not done  Fine Touch: normal  Testing/Developmental Screens:AS/RS 13/10  DIAGNOSES:    ICD-9-CM ICD-10-CM   1. Developmental dysgraphia 784.69 R48.8   2. ADHD (attention deficit hyperactivity disorder), combined type 314.01 F90.2     RECOMMENDATIONS:  Patient Instructions  Continue  daytrana patch 30 mg every morning  buspar 5 mg 1-2 times daily Discussed need to take medication daily, side effects, efficient use of medication, need for better focus in classroom, etc Discussed development-on BCP which are stable throughout the month Discussed college progress   NEXT APPOINTMENT: Return in about 3 months (around 03/30/2017), or if symptoms worsen or fail to improve, for Medical follow up.   Nicholos Johns, NP Counseling Time: 30 Total Contact Time: 50 More than 50% of the visit involved counseling, discussing the diagnosis and management of symptoms with the patient and family

## 2016-12-31 ENCOUNTER — Telehealth: Payer: Self-pay | Admitting: *Deleted

## 2016-12-31 NOTE — Telephone Encounter (Signed)
-----   Message from Ria CommentPatricia Grubb, FNP sent at 12/31/2016  8:16 AM EST ----- Please let pt know that STD panel with Hep B, HIV, STS, GC, and chlamydia is all negative.  The pap is normal 02 recall.

## 2016-12-31 NOTE — Telephone Encounter (Signed)
I have attempted to contact this patient by phone with the following results: Mailbox full. Unable to leave message. I will continue to try later. [phone] (Mobile) *Preferred* 

## 2017-01-02 NOTE — Progress Notes (Signed)
Encounter reviewed by Dr. Macayla Ekdahl Amundson C. Silva.  

## 2017-01-03 NOTE — Telephone Encounter (Signed)
I have attempted to contact this patient by phone with the following results: Mailbox full. Unable to leave message. I will continue to try later. [phone] (Mobile) *Preferred*

## 2017-01-10 ENCOUNTER — Encounter: Payer: Self-pay | Admitting: *Deleted

## 2017-01-19 ENCOUNTER — Telehealth: Payer: Self-pay | Admitting: Nurse Practitioner

## 2017-01-19 NOTE — Telephone Encounter (Signed)
Returned call to patient.  Advised letter was sent due to being unable to leave message on voicemail as mailbox was full.  Results of recent labs given and documented in Result Note.

## 2017-01-19 NOTE — Telephone Encounter (Signed)
Patient is returning a call to Stephanie. °

## 2017-03-18 ENCOUNTER — Other Ambulatory Visit: Payer: Self-pay | Admitting: Nurse Practitioner

## 2017-03-21 ENCOUNTER — Institutional Professional Consult (permissible substitution): Payer: Self-pay | Admitting: Family

## 2017-03-21 ENCOUNTER — Ambulatory Visit (INDEPENDENT_AMBULATORY_CARE_PROVIDER_SITE_OTHER): Payer: Managed Care, Other (non HMO) | Admitting: Pediatrics

## 2017-03-21 ENCOUNTER — Encounter: Payer: Self-pay | Admitting: Pediatrics

## 2017-03-21 VITALS — BP 108/70 | Wt 137.6 lb

## 2017-03-21 DIAGNOSIS — F902 Attention-deficit hyperactivity disorder, combined type: Secondary | ICD-10-CM

## 2017-03-21 DIAGNOSIS — R278 Other lack of coordination: Secondary | ICD-10-CM

## 2017-03-21 DIAGNOSIS — R488 Other symbolic dysfunctions: Secondary | ICD-10-CM

## 2017-03-21 MED ORDER — BUSPIRONE HCL 5 MG PO TABS: 5.0000 mg | ORAL_TABLET | Freq: Two times a day (BID) | ORAL | 2 refills | Status: DC

## 2017-03-21 MED ORDER — DAYTRANA 30 MG/9HR TD PTCH: 1.0000 | MEDICATED_PATCH | Freq: Every day | TRANSDERMAL | 0 refills | Status: DC

## 2017-03-21 NOTE — Patient Instructions (Signed)
Continue meds as prescribed.

## 2017-03-21 NOTE — Progress Notes (Signed)
[facility] [place] [facility] [address] [place] [place] [address] Dept: [phone] Dept Fax: [phone] Loc: [phone] Loc Fax: [phone]  Medical Follow-up  Patient ID: Veronica Rice, female  DOB: 10-May-1996, 21 y.o.  MRN: [id number]  Date of Evaluation: 03/21/17  PCP: Gweneth Dimitri, MD  Accompanied by: Mother Patient Lives with: in college  HISTORY/CURRENT STATUS:  HPI  Routine visit, medication check Still struggles at school. Has to take a class over because she didn't hear that the exam was rescheduled and missed it Hasn't been using medication as she should, no buspar and only uses daytrana on test days. 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 Strep throat x 5 in past year, to have T&A during summer, Review of Systems  Constitutional: Negative.  Negative for chills, diaphoresis, fever, malaise/fatigue and weight loss.  HENT: Negative.  Negative for congestion, ear discharge, ear pain, hearing loss, nosebleeds, sinus pain, sore throat and tinnitus.   Eyes: Negative.  Negative for blurred vision, double vision, photophobia, pain, discharge and redness.  Respiratory: Negative.  Negative for cough, hemoptysis, sputum production, shortness of breath, wheezing and  stridor.   Cardiovascular: Negative.  Negative for chest pain, palpitations, orthopnea, claudication, leg swelling and PND.  Gastrointestinal: Negative.  Negative for abdominal pain, blood in stool, constipation, diarrhea, heartburn, melena, nausea and vomiting.  Genitourinary: Negative.  Negative for dysuria, flank pain, frequency, hematuria and urgency.  Musculoskeletal: Negative.  Negative for back pain, falls, joint pain, myalgias and neck pain.  Skin: Negative.  Negative for itching and rash.  Neurological: Negative.  Negative for dizziness, tingling, tremors, sensory change, speech change, focal weakness, seizures, loss of consciousness, weakness and headaches.  Endo/Heme/Allergies: Negative.  Negative for environmental allergies and polydipsia. Does not bruise/bleed easily.  Psychiatric/Behavioral: Negative.  Negative for depression, hallucinations, memory loss, substance abuse and suicidal ideas. The patient is not nervous/anxious and does not have insomnia.     Allergies: Amoxicillin  Current Medications:  Current Outpatient Prescriptions:  .  ALBUTEROL IN, Inhale into the lungs as needed., Disp: , Rfl:  .  busPIRone (BUSPAR) 5 MG tablet, Take 1 tablet (5 mg total) by mouth 2 (two) times daily., Disp: 60 tablet, Rfl: 2 .  DAYTRANA 30 MG/9HR, Place 1 patch onto the skin daily. wear patch for 9 hours only each day, Disp: 30 patch, Rfl: 0 .  Norethindrone Acetate-Ethinyl Estradiol (LOESTRIN 1.5/30, 21,) 1.5-30 MG-MCG tablet, Take 1 tablet by mouth daily., Disp: 3 Package, Rfl: 4 .  omeprazole (PRILOSEC) 40 MG capsule, Take 40 mg by mouth daily., Disp: , Rfl: 3 Medication Side Effects: Appetite Suppression  Family Medical/Social History Changes?: No  MENTAL HEALTH: Mental Health Issues: good social skills  PHYSICAL EXAM: Vitals:  Today's Vitals   03/21/17 1237  BP: 108/70  Weight: 137 lb 9.6 oz (62.4 kg)  PainSc: 0-No pain  , Facility age limit for growth percentiles is 20  years.  General Exam: Physical  Exam  Constitutional: She is oriented to person, place, and time. She appears well-developed and well-nourished. No distress.  HENT:  Head: Normocephalic and atraumatic.  Right Ear: External ear normal.  Left Ear: External ear normal.  Nose: Nose normal.  Mouth/Throat: Oropharynx is clear and moist. No oropharyngeal exudate.  Eyes: Conjunctivae and EOM are normal. Pupils are equal, round, and reactive to light. Right eye exhibits no discharge. Left eye exhibits no discharge. No scleral icterus.  Neck: Normal range of motion. Neck supple. No JVD present. No tracheal deviation present. No thyromegaly present.  Cardiovascular: Normal rate, regular rhythm, normal heart sounds and intact distal pulses.  Exam reveals no gallop and no friction rub.   No murmur heard. Pulmonary/Chest: Effort normal and breath sounds normal. No stridor. No respiratory distress. She has no wheezes. She has no rales. She exhibits no tenderness.  Abdominal: Soft. Bowel sounds are normal. She exhibits no distension and no mass. There is no tenderness. There is no rebound and no guarding. No hernia.  Musculoskeletal: Normal range of motion. She exhibits no edema, tenderness or deformity.  Lymphadenopathy:    She has no cervical adenopathy.  Neurological: She is alert and oriented to person, place, and time. She has normal reflexes. She displays normal reflexes. No cranial nerve deficit or sensory deficit. She exhibits normal muscle tone. Coordination normal.  Skin: Skin is warm and dry. No rash noted. She is not diaphoretic. No erythema. No pallor.  Psychiatric: Her behavior is normal. Judgment and thought content normal.  Difficulty initiating sleep-has not used buspar in pm, unable to shut her brain down   Vitals reviewed.   Neurological: oriented to time, place, and person Cranial Nerves: normal  Neuromuscular:  Motor Mass: normal Tone: normal Strength: normal DTRs: normal 2+ and  symmetric Overflow: mild Reflexes: no tremors noted, finger to nose without dysmetria bilaterally, performs thumb to finger exercise without difficulty, gait was normal and tandem gait was normal Sensory Exam: Vibratory: not done  Fine Touch: normal  Testing/Developmental Screens:AS/RS 20/11   DIAGNOSES:    ICD-9-CM ICD-10-CM   1. Developmental dysgraphia 784.69 R48.8   2. ADHD (attention deficit hyperactivity disorder), combined type 314.01 F90.2     RECOMMENDATIONS:  Patient Instructions  Continue meds as prescribed Discussed need to take medication daily, side effects, efficient use of medication, need for better focus in classroom, etc Discussed development-on BCP which are stable throughout the month Discussed college progress Discussed counseling for mother and Johnesha  NEXT APPOINTMENT: Return in about 3 months (around 06/20/2017), or if symptoms worsen or fail to improve, for Medical follow up.   Nicholos Johns, NP Counseling Time: 30 Total Contact Time: 50 More than 50% of the visit involved counseling, discussing the diagnosis and management of symptoms with the patient and family

## 2017-03-28 ENCOUNTER — Other Ambulatory Visit: Payer: Self-pay | Admitting: Pediatrics

## 2017-05-05 ENCOUNTER — Telehealth: Payer: Self-pay | Admitting: Pediatrics

## 2017-05-05 NOTE — Telephone Encounter (Signed)
[facility] requesting prior [facility] for Daytrana 30 mg.  Patient last seen 03/21/17, next appointment 07/08/17.

## 2017-05-17 NOTE — Telephone Encounter (Signed)
New Insurance not [place]. Information from pharmacy for [employer] pharm plan. PA submitted via Cover My Meds.

## 2017-05-17 NOTE — Telephone Encounter (Signed)
APPROVED  Through 05/13/2018

## 2017-07-08 ENCOUNTER — Institutional Professional Consult (permissible substitution): Payer: Managed Care, Other (non HMO) | Admitting: Pediatrics

## 2017-07-28 ENCOUNTER — Encounter: Payer: Self-pay | Admitting: Pediatrics

## 2017-07-28 ENCOUNTER — Ambulatory Visit (INDEPENDENT_AMBULATORY_CARE_PROVIDER_SITE_OTHER): Payer: Managed Care, Other (non HMO) | Admitting: Pediatrics

## 2017-07-28 VITALS — Wt 135.6 lb

## 2017-07-28 DIAGNOSIS — R278 Other lack of coordination: Secondary | ICD-10-CM

## 2017-07-28 DIAGNOSIS — F902 Attention-deficit hyperactivity disorder, combined type: Secondary | ICD-10-CM | POA: Diagnosis not present

## 2017-07-28 DIAGNOSIS — R488 Other symbolic dysfunctions: Secondary | ICD-10-CM | POA: Diagnosis not present

## 2017-07-28 MED ORDER — DAYTRANA 30 MG/9HR TD PTCH: 1.0000 | MEDICATED_PATCH | Freq: Every day | TRANSDERMAL | 0 refills | Status: DC

## 2017-07-28 MED ORDER — BUSPIRONE HCL 5 MG PO TABS: 5.0000 mg | ORAL_TABLET | Freq: Two times a day (BID) | ORAL | 2 refills | Status: DC

## 2017-07-28 NOTE — Patient Instructions (Signed)
Continue daytrana patch 30 mg every morning  buspar 10 mg 1-2 times daily

## 2017-07-28 NOTE — Progress Notes (Signed)
[facility] [facility] [address] [place] [address] Dept: [phone] Dept Fax: [phone] Loc: [phone] Loc Fax: [phone]  Medical Follow-up  Patient ID: Carlena HurlElizabeth G Byland, female  DOB: 03/16/1996, 21 y.o.  MRN: [id number]  Date of Evaluation: 07/28/17  PCP: Gweneth DimitriMcNeill, Wendy, MD  Accompanied by: Mother Patient Lives with: college  HISTORY/CURRENT STATUS:  HPI  Routine 3 month visit, medication check Failed 2 summer classes-off medication, this course on meds-almost A, parents are making her pay for courses she fails Changed major, communication with [employer],  To have tonsils out tomorrow  EDUCATION: School: [employer] Year/Grade: 3rd year Homework Time: vacation Performance/Grades: above average Services: IEP/504 Plan Activities/Exercise: walks  MEDICAL HISTORY: Appetite: fairly good MVI/Other: none Fruits/Vegs:good Calcium: some yogurt Iron:some meats  Sleep: Bedtime: varies Awakens: varies Sleep Concerns: Initiation/Maintenance/Other: ok unless she forgets to take patch off  Individual Medical History/Review of System Changes? Yes to have tonsils out-6 strep throats,  Review of Systems  Constitutional: Negative.  Negative for chills, diaphoresis, fever, malaise/fatigue and weight loss.  HENT: Negative.  Negative for congestion, ear discharge, ear pain, hearing loss, nosebleeds, sinus pain, sore throat and tinnitus.   Eyes: Negative.  Negative for blurred vision, double vision, photophobia, pain, discharge and redness.  Respiratory: Negative.  Negative for cough, hemoptysis, sputum production, shortness of breath, wheezing and stridor.   Cardiovascular: Negative.  Negative for chest pain, palpitations, orthopnea, claudication, leg swelling and PND.  Gastrointestinal: Negative.  Negative for abdominal pain, blood in stool, constipation,  diarrhea, heartburn, melena, nausea and vomiting.  Genitourinary: Negative.  Negative for dysuria, flank pain, frequency, hematuria and urgency.  Musculoskeletal: Negative.  Negative for back pain, falls, joint pain, myalgias and neck pain.  Skin: Negative.  Negative for itching and rash.  Neurological: Negative.  Negative for dizziness, tingling, tremors, sensory change, speech change, focal weakness, seizures, loss of consciousness, weakness and headaches.  Endo/Heme/Allergies: Negative.  Negative for environmental allergies and polydipsia. Does not bruise/bleed easily.  Psychiatric/Behavioral: Negative.  Negative for depression, hallucinations, memory loss, substance abuse and suicidal ideas. The patient is not nervous/anxious and does not have insomnia.     Allergies: Amoxicillin  Current Medications:  Current Outpatient Prescriptions:  .  ALBUTEROL IN, Inhale into the lungs as needed., Disp: , Rfl:  .  busPIRone (BUSPAR) 5 MG tablet, Take 1 tablet (5 mg total) by mouth 2 (two) times daily., Disp: 60 tablet, Rfl: 2 .  DAYTRANA 30 MG/9HR, Place 1 patch onto the skin daily. wear patch for 9 hours only each day, Disp: 30 patch, Rfl: 0 .  Norethindrone Acetate-Ethinyl Estradiol (LOESTRIN 1.5/30, 21,) 1.5-30 MG-MCG tablet, Take 1 tablet by mouth daily., Disp: 3 Package, Rfl: 4 .  omeprazole (PRILOSEC) 40 MG capsule, Take 40 mg by mouth daily., Disp: , Rfl: 3 Medication Side Effects: None  Family Medical/Social History Changes?: No  MENTAL HEALTH: Mental Health Issues: good social skills  PHYSICAL EXAM: Vitals:  Today's Vitals   07/28/17 1113  Weight: 135 lb 9.6 oz (61.5 kg)  PainSc: 0-No pain  , Facility age limit for growth percentiles is 20 years.  General Exam: Physical Exam  Constitutional: She is oriented to person, place, and time. She appears well-developed and well-nourished. No distress.  HENT:  Head: Normocephalic and atraumatic.  Right Ear: External ear normal.  Left  Ear: External ear normal.  Nose: Nose normal.  Mouth/Throat: Oropharynx is clear and  moist. No oropharyngeal exudate.  Eyes: Pupils are equal, round, and reactive to light. Conjunctivae and EOM are normal. Right eye exhibits no discharge. Left eye exhibits no discharge. No scleral icterus.  Neck: Normal range of motion. Neck supple. No JVD present. No tracheal deviation present. No thyromegaly present.  Cardiovascular: Normal rate, regular rhythm, normal heart sounds and intact distal pulses.  Exam reveals no gallop and no friction rub.   No murmur heard. Pulmonary/Chest: Effort normal and breath sounds normal. No stridor. No respiratory distress. She has no wheezes. She has no rales. She exhibits no tenderness.  Abdominal: Soft. Bowel sounds are normal. She exhibits no distension and no mass. There is no tenderness. There is no rebound and no guarding. No hernia.  Musculoskeletal: Normal range of motion. She exhibits no edema, tenderness or deformity.  Lymphadenopathy:    She has no cervical adenopathy.  Neurological: She is alert and oriented to person, place, and time. She has normal reflexes. She displays normal reflexes. No cranial nerve deficit or sensory deficit. She exhibits normal muscle tone. Coordination normal.  Skin: Skin is warm and dry. No rash noted. She is not diaphoretic. No erythema. No pallor.  Psychiatric: She has a normal mood and affect. Her behavior is normal. Judgment and thought content normal.  Vitals reviewed.   Neurological: oriented to time, place, and person Cranial Nerves: normal  Neuromuscular:  Motor Mass: normal Tone: normal Strength: normal DTRs: 2+ and symmetric Overflow: mild Reflexes: no tremors noted, finger to nose without dysmetria bilaterally, performs thumb to finger exercise without difficulty, gait was normal and tandem gait was normal Sensory Exam:   Fine Touch: normal  Testing/Developmental Screens: CGI:6  DIAGNOSES:    ICD-10-CM   1.  Developmental dysgraphia R48.8   2. ADHD (attention deficit hyperactivity disorder), combined type F90.2     RECOMMENDATIONS:  Patient Instructions  Continue daytrana patch 30 mg every morning  buspar 10 mg 1-2 times daily discussed consistent use of patch, skin care, eating patterns, using alarms to remember Discussed school progress and changes  NEXT APPOINTMENT: Return in about 3 months (around 11/07/2017), or if symptoms worsen or fail to improve, for Medical follow up.   Nicholos Johns, NP Counseling Time: 30 Total Contact Time: 50 More than 50% of the visit involved counseling, discussing the diagnosis and management of symptoms with the patient and family

## 2017-11-09 ENCOUNTER — Institutional Professional Consult (permissible substitution): Payer: Self-pay | Admitting: Pediatrics

## 2017-12-23 ENCOUNTER — Encounter: Payer: Self-pay | Admitting: Pediatrics

## 2017-12-23 ENCOUNTER — Ambulatory Visit (INDEPENDENT_AMBULATORY_CARE_PROVIDER_SITE_OTHER): Payer: 59 | Admitting: Pediatrics

## 2017-12-23 VITALS — Wt 141.2 lb

## 2017-12-23 DIAGNOSIS — Z79899 Other long term (current) drug therapy: Secondary | ICD-10-CM | POA: Diagnosis not present

## 2017-12-23 DIAGNOSIS — Z7189 Other specified counseling: Secondary | ICD-10-CM | POA: Diagnosis not present

## 2017-12-23 DIAGNOSIS — R488 Other symbolic dysfunctions: Secondary | ICD-10-CM | POA: Diagnosis not present

## 2017-12-23 DIAGNOSIS — F902 Attention-deficit hyperactivity disorder, combined type: Secondary | ICD-10-CM

## 2017-12-23 DIAGNOSIS — Z719 Counseling, unspecified: Secondary | ICD-10-CM | POA: Diagnosis not present

## 2017-12-23 DIAGNOSIS — R278 Other lack of coordination: Secondary | ICD-10-CM

## 2017-12-23 MED ORDER — DAYTRANA 30 MG/9HR TD PTCH: 1.0000 | MEDICATED_PATCH | Freq: Every day | TRANSDERMAL | 0 refills | Status: DC

## 2017-12-23 MED ORDER — BUSPIRONE HCL 5 MG PO TABS: 5.0000 mg | ORAL_TABLET | Freq: Two times a day (BID) | ORAL | 2 refills | Status: DC

## 2017-12-23 NOTE — Patient Instructions (Addendum)
Continue daytrana  30 mg every morning  buspar 5 mg every morning and afternoon Discussed medication and dosing and need for consistency at length Discussed college issues and needs to take medication consistently, need for daily routine, etc Discussed need for daily exercise

## 2017-12-23 NOTE — Progress Notes (Signed)
[facility] [address] [place] [address] Dept: [phone] Dept Fax: [phone] Loc: [phone] Loc Fax: [phone]  Medical Follow-up  Patient ID: Veronica Rice, female  DOB: 09/25/1996, 22 y.o.  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 No Review of Systems  Constitutional: Positive for malaise/fatigue. Negative for chills, diaphoresis, fever and weight loss.  HENT: Negative.  Negative for congestion, ear discharge, ear pain, hearing loss, nosebleeds, sinus pain, sore throat and tinnitus.   Eyes: Negative.  Negative for blurred vision, double vision, photophobia, pain, discharge and redness.  Respiratory: Negative.  Negative for cough, hemoptysis, sputum production, shortness of breath, wheezing and stridor.   Cardiovascular: Negative.  Negative for chest pain, palpitations, orthopnea, claudication, leg swelling and PND.  Gastrointestinal: Negative.  Negative for abdominal pain, blood in stool, constipation, diarrhea, heartburn, melena,  nausea and vomiting.  Genitourinary: Negative.  Negative for dysuria, flank pain, frequency, hematuria and urgency.  Musculoskeletal: Negative.  Negative for back pain, falls, joint pain, myalgias and neck pain.  Skin: Negative.  Negative for itching and rash.  Neurological: Negative.  Negative for dizziness, tingling, tremors, sensory change, speech change, focal weakness, seizures, loss of consciousness, weakness and headaches.  Endo/Heme/Allergies: Negative.  Negative for environmental allergies and polydipsia. Does not bruise/bleed easily.  Psychiatric/Behavioral: Negative.  Negative for depression, hallucinations, memory loss, substance abuse and suicidal ideas. The patient is not nervous/anxious and does not have insomnia.     Allergies: Amoxicillin  Current Medications:  Current Outpatient Medications:  .  ALBUTEROL IN, Inhale into the lungs as needed., Disp: , Rfl:  .  busPIRone (BUSPAR) 5 MG tablet, Take 1 tablet (5 mg total) by mouth 2 (two) times daily., Disp: 60 tablet, Rfl: 2 .  DAYTRANA 30 MG/9HR, Place 1 patch onto the skin daily. wear patch for 9 hours only each day, Disp: 30 patch, Rfl: 0 .  Norethindrone Acetate-Ethinyl Estradiol (LOESTRIN 1.5/30, 21,) 1.5-30 MG-MCG tablet, Take 1 tablet by mouth daily., Disp: 3 Package, Rfl: 4 .  omeprazole (PRILOSEC) 40 MG capsule, Take 40 mg by mouth daily., Disp: , Rfl: 3 Medication Side Effects: None  Family Medical/Social History Changes?: No  MENTAL HEALTH: Mental Health Issues: good social skills  PHYSICAL EXAM: Vitals:  Today's Vitals   12/23/17 1007  Weight: 141 lb 3.2 oz (64 kg)  PainSc: 0-No pain  , Facility age limit for growth percentiles is 20 years.  General Exam: Physical Exam  Constitutional: She is oriented to person, place, and time. She appears well-developed and well-nourished. No distress.  HENT:  Head: Normocephalic and atraumatic.  Right Ear: External ear normal.  Left Ear: External ear normal.  Nose:  Nose normal.  Mouth/Throat: Oropharynx is clear and moist. No oropharyngeal exudate.  Eyes: Conjunctivae and EOM are normal. Pupils are equal, round, and reactive to light. Right eye exhibits no discharge. Left eye exhibits no discharge. No scleral icterus.  Neck: Normal range of motion. Neck supple. No JVD present. No tracheal deviation present. No thyromegaly present.  Cardiovascular: Normal rate, regular rhythm, normal heart sounds and intact distal pulses. Exam reveals no gallop and no friction rub.  No murmur heard. Pulmonary/Chest: Effort normal and breath sounds normal. No stridor. No respiratory distress. She has no wheezes. She has no rales. She exhibits no tenderness.  Abdominal: Soft. Bowel sounds are normal. She exhibits no distension and no mass. There is no tenderness. There is no rebound and no guarding. No hernia.  Musculoskeletal: Normal range of motion. She exhibits no edema, tenderness or deformity.  Lymphadenopathy:    She has no cervical adenopathy.  Neurological: She is alert and oriented to person, place, and time. She has normal reflexes. She displays normal reflexes. No cranial nerve deficit or sensory deficit. She exhibits normal muscle tone. Coordination normal.  Skin: Skin is warm and dry. No rash noted. She is not diaphoretic. No erythema. No pallor.  Psychiatric: She has a normal mood and affect. Her behavior is normal. Thought content normal.  Vitals reviewed. makes poor choices  Neurological: oriented to time, place, and person Cranial Nerves: normal  Neuromuscular:  Motor Mass: normal Tone: normal Strength: normal DTRs: 2+ and symmetric Overflow: mild Reflexes: no tremors noted, finger to nose without dysmetria bilaterally, performs thumb to finger exercise without difficulty, gait was normal and tandem gait was normal Sensory Exam: normal  Fine Touch: normal  Testing/Developmental Screens:  AS/RS 17/11  DIAGNOSES:    ICD-10-CM   1. Developmental  dysgraphia R48.8   2. ADHD (attention deficit hyperactivity disorder), combined type F90.2   3. Patient counseled Z71.9   4. Medication management Z79.899   5. Coordination of complex care Z71.89     RECOMMENDATIONS:  Patient Instructions  Continue daytrana  30 mg every morning  buspar 5 mg every morning and afternoon Discussed medication and dosing and need for consistency at length Discussed college issues and needs to take medication consistently, need for daily routine, etc   NEXT APPOINTMENT: Return in about 3 months (around 03/23/2018), or if symptoms worsen or fail to improve, for Medical follow up.   Nicholos Johns, NP Counseling Time: 30 Total Contact Time: 50 More than 50% of the visit involved counseling, discussing the diagnosis and management of symptoms with the patient and family

## 2018-01-17 ENCOUNTER — Other Ambulatory Visit: Payer: Self-pay | Admitting: *Deleted

## 2018-01-17 NOTE — Telephone Encounter (Signed)
Medication refill request: OCP  Last AEX:  12/29/16 PG  Next AEX: 02/24/18  Last MMG (if hormonal medication request): n/a  Refill authorized: 12/29/16 #3, 4RF. Today, please advise.   Spoke with patient. Patient states she is student at [employer] and [facility] [place] [facility] [address]. Patient states due to this being her last semester in college, she may need to do her aex and pap at [facility] on campus and send results to our office. States she would discuss with her mom and return call.

## 2018-01-18 MED ORDER — NORETHINDRONE ACET-ETHINYL EST 1.5-30 MG-MCG PO TABS: 1.0000 | ORAL_TABLET | Freq: Every day | ORAL | 0 refills | Status: DC

## 2018-02-24 ENCOUNTER — Encounter: Payer: Self-pay | Admitting: Obstetrics & Gynecology

## 2018-02-24 ENCOUNTER — Other Ambulatory Visit (HOSPITAL_COMMUNITY)
Admission: RE | Admit: 2018-02-24 | Discharge: 2018-02-24 | Disposition: A | Discharge status: Home / Self Care | Payer: Managed Care, Other (non HMO) | Source: Ambulatory Visit | Attending: Obstetrics & Gynecology | Admitting: Obstetrics & Gynecology

## 2018-02-24 ENCOUNTER — Other Ambulatory Visit: Payer: Self-pay

## 2018-02-24 ENCOUNTER — Ambulatory Visit (INDEPENDENT_AMBULATORY_CARE_PROVIDER_SITE_OTHER): Payer: Managed Care, Other (non HMO) | Admitting: Obstetrics & Gynecology

## 2018-02-24 VITALS — BP 122/80 | HR 72 | Resp 16 | Ht 70.0 in | Wt 142.0 lb

## 2018-02-24 DIAGNOSIS — Z01419 Encounter for gynecological examination (general) (routine) without abnormal findings: Secondary | ICD-10-CM

## 2018-02-24 DIAGNOSIS — Z124 Encounter for screening for malignant neoplasm of cervix: Secondary | ICD-10-CM | POA: Insufficient documentation

## 2018-02-24 MED ORDER — NORETHIN ACE-ETH ESTRAD-FE 1.5-30 MG-MCG PO TABS: 1.0000 | ORAL_TABLET | Freq: Every day | ORAL | 4 refills | Status: DC

## 2018-02-24 NOTE — Progress Notes (Signed)
22 y.o. G0P0000 SingleCaucasianF here for annual exam.  Doing well.  Just got back from spring break trip to the [place].    Senior at [employer].    Cycles are regular.  Flow typically lasts about 5 days.   Had UTI about a month ago.  Did STD testing then.  Went to a walk in clinic in [place].     Patient's last menstrual period was 02/06/2018.          Sexually active: Yes.    The current method of family planning is OCP (estrogen/progesterone).    Exercising: Yes.    walk Smoker:  no  Health Maintenance: Pap:  12/29/16 neg  History of abnormal Pap:  no MMG:  Never Gardasil: completed  TDaP:  2015 Screening Labs: Here today    reports that  has never smoked. she has never used smokeless tobacco. She reports that she drinks about 1.2 - 1.8 oz of alcohol per week. She reports that she does not use drugs.  Past Medical History:  Diagnosis Date  . ADD (attention deficit disorder) since age [age]   mild hyperactitivy aspect  . Asthma    Sports Induced    Past Surgical History:  Procedure Laterality Date  . APPENDECTOMY  2011  . NOSE SURGERY  05/2015   turbinate reduction and fracture correction  . WISDOM TOOTH EXTRACTION  2013    Current Outpatient Medications  Medication Sig Dispense Refill  . ALBUTEROL IN Inhale into the lungs as needed.    . busPIRone (BUSPAR) 5 MG tablet Take 1 tablet (5 mg total) by mouth 2 (two) times daily. 60 tablet 2  . DAYTRANA 30 MG/9HR Place 1 patch onto the skin daily. wear patch for 9 hours only each day 30 patch 0  . Norethindrone Acetate-Ethinyl Estradiol (LOESTRIN 1.5/30, 21,) 1.5-30 MG-MCG tablet Take 1 tablet by mouth daily. 3 Package 0  . omeprazole (PRILOSEC) 40 MG capsule Take 40 mg by mouth daily.  3   No current facility-administered medications for this visit.     Family History  Problem Relation Age of Onset  . Thyroid disease Father        Hypothyroidism  . Breast cancer Maternal Grandmother 60  . Anxiety disorder Maternal  Grandmother   . Hypertension Maternal Grandfather   . Heart disease Maternal Grandfather   . Heart disease Paternal Grandfather   . Colon cancer Paternal Grandfather   . Parkinson's disease Paternal Grandfather     ROS:  Pertinent items are noted in HPI.  Otherwise, a comprehensive ROS was negative.  Exam:   BP 122/80 (BP Location: Right Arm, Patient Position: Sitting, Cuff Size: Normal)   Pulse 72   Resp 16   Ht 5\' 10"  (1.778 m)   Wt 142 lb (64.4 kg)   LMP 02/06/2018   BMI 20.37 kg/m     Height: 5\' 10"  (177.8 cm)  Ht Readings from Last 3 Encounters:  02/24/18 5\' 10"  (1.778 m)  12/29/16 5\' 10"  (1.778 m)  12/29/15 5' 9.5" (1.765 m)    General appearance: alert, cooperative and appears stated age Head: Normocephalic, without obvious abnormality, atraumatic Neck: no adenopathy, supple, symmetrical, trachea midline and thyroid normal to inspection and palpation Lungs: clear to auscultation bilaterally Breasts: normal appearance, no masses or tenderness Heart: regular rate and rhythm Abdomen: soft, non-tender; bowel sounds normal; no masses,  no organomegaly Extremities: extremities normal, atraumatic, no cyanosis or edema Skin: Skin color, texture, turgor normal. No rashes or lesions  Lymph nodes: Cervical, supraclavicular, and axillary nodes normal. No abnormal inguinal nodes palpated Neurologic: Grossly normal   Pelvic: External genitalia:  no lesions              Urethra:  normal appearing urethra with no masses, tenderness or lesions              Bartholins and Skenes: normal                 Vagina: normal appearing vagina with normal color and discharge, no lesions              Cervix: no lesions              Pap taken: Yes.   Bimanual Exam:  Uterus:  normal size, contour, position, consistency, mobility, non-tender              Adnexa: normal adnexa and no mass, fullness, tenderness               Rectovaginal: Confirms               Anus:  normal sphincter tone, no  lesions  Chaperone was present for exam.  A:  Well Woman with normal exam +SA with recent STD testing Considering different options for contraception GERD ADHD Anxiety Family hx of breast cancer in MGM aged 62 at diagnosis  P:   Mammogram not indicated.  Will start ~age 10. pap smear repeated due to scant cellularity with last pap smear RF for Loestrin FE 1.5/30 #3 month supply/4RF. Information regarding alternative options for contraception given Return annually or prn

## 2018-02-27 LAB — CYTOLOGY - PAP: DIAGNOSIS: NEGATIVE

## 2019-03-06 ENCOUNTER — Other Ambulatory Visit: Payer: Self-pay | Admitting: Obstetrics & Gynecology

## 2019-03-06 NOTE — Telephone Encounter (Signed)
Medication refill request: Junel Fe  Last AEX:  02/24/18 Next AEX: 04/23/19 Last MMG (if hormonal medication request): NA Refill authorized: #[id number] with 0 RF

## 2019-04-23 ENCOUNTER — Ambulatory Visit: Payer: Managed Care, Other (non HMO) | Admitting: Obstetrics & Gynecology

## 2019-05-26 ENCOUNTER — Other Ambulatory Visit: Payer: Self-pay | Admitting: Obstetrics & Gynecology

## 2019-05-28 NOTE — Telephone Encounter (Signed)
Medication refill request: Junel  Last AEX:  02-24-18 SM  Next AEX: 06-21-2019  Last MMG (if hormonal medication request): n/a Refill authorized: 03-09-2019 #84, 0RF. Today, please advise.   Call to patient. Patient's aex scheduled for 06-21-2019 after review with [facility], CMA. Patient requests refill for OCP [facility] [place] [facility] [address]. Patient advise would send request to Dr. Sabra Heck for review. Patient agreeable. Medication pended for #28, 0RF as patient has appointment on 06-21-2019. Please refill if appropriate.

## 2019-06-19 ENCOUNTER — Other Ambulatory Visit: Payer: Self-pay

## 2019-06-21 ENCOUNTER — Encounter: Payer: Self-pay | Admitting: Obstetrics & Gynecology

## 2019-06-21 ENCOUNTER — Other Ambulatory Visit: Payer: Self-pay

## 2019-06-21 ENCOUNTER — Ambulatory Visit (INDEPENDENT_AMBULATORY_CARE_PROVIDER_SITE_OTHER): Payer: Managed Care, Other (non HMO) | Admitting: Obstetrics & Gynecology

## 2019-06-21 ENCOUNTER — Other Ambulatory Visit (HOSPITAL_COMMUNITY)
Admission: RE | Admit: 2019-06-21 | Discharge: 2019-06-21 | Disposition: A | Discharge status: Home / Self Care | Payer: Managed Care, Other (non HMO) | Source: Ambulatory Visit | Attending: Obstetrics & Gynecology | Admitting: Obstetrics & Gynecology

## 2019-06-21 VITALS — BP 122/62 | HR 68 | Temp 98.1°F | Ht 70.0 in | Wt 145.0 lb

## 2019-06-21 DIAGNOSIS — L700 Acne vulgaris: Secondary | ICD-10-CM | POA: Diagnosis not present

## 2019-06-21 DIAGNOSIS — N920 Excessive and frequent menstruation with regular cycle: Secondary | ICD-10-CM | POA: Diagnosis not present

## 2019-06-21 DIAGNOSIS — Z124 Encounter for screening for malignant neoplasm of cervix: Secondary | ICD-10-CM | POA: Insufficient documentation

## 2019-06-21 DIAGNOSIS — Z113 Encounter for screening for infections with a predominantly sexual mode of transmission: Secondary | ICD-10-CM | POA: Insufficient documentation

## 2019-06-21 DIAGNOSIS — Z01419 Encounter for gynecological examination (general) (routine) without abnormal findings: Secondary | ICD-10-CM | POA: Diagnosis not present

## 2019-06-21 NOTE — Progress Notes (Addendum)
23 y.o. G0P0000 Single White or Caucasian female here for annual exam.  Still considering an IUD for contraception.  She is currently on OCPs.  Flow lasts 5-6 days.  Flow is heavy for several days.    Reports having cystic acne issues that do seem a little worse.  Has been advised in the past that she may PCOS.  She is not seeing a dermatologist at this time.  She is concerned about whether acne is related to PCOS.  She is not currently SA but had had a new partner in the last year.    Patient's last menstrual period was 06/05/2019 (approximate).          Sexually active: Yes.    The current method of family planning is OCP (estrogen/progesterone).    Exercising: Yes.    cardio, weights Smoker:  no  Health Maintenance: Pap:  02/24/18 neg   12/29/16 neg  History of abnormal Pap:  no TDaP:  2019  Gardasil: completed  Screening Labs: here today    reports that she has never smoked. She has never used smokeless tobacco. She reports current alcohol use of about 3.0 - 5.0 standard drinks of alcohol per week. She reports that she does not use drugs.  Past Medical History:  Diagnosis Date  . ADD (attention deficit disorder) since age 56-8   mild hyperactitivy aspect  . Anxiety   . Asthma    Sports Induced  . GERD (gastroesophageal reflux disease)     Past Surgical History:  Procedure Laterality Date  . APPENDECTOMY  2011  . NOSE SURGERY  05/2015   turbinate reduction and fracture correction  . WISDOM TOOTH EXTRACTION  2013    Current Outpatient Medications  Medication Sig Dispense Refill  . albuterol (VENTOLIN HFA) 108 (90 Base) MCG/ACT inhaler Inhale into the lungs.    . busPIRone (BUSPAR) 5 MG tablet Take 1 tablet (5 mg total) by mouth 2 (two) times daily. 60 tablet 2  . JUNEL FE 1.5/30 1.5-30 MG-MCG tablet TAKE 1 TABLET BY MOUTH EVERY DAY 84 tablet 0  . DAYTRANA 30 MG/9HR Place 1 patch onto the skin daily. wear patch for 9 hours only each day (Patient not taking: Reported on  06/21/2019) 30 patch 0  . omeprazole (PRILOSEC) 40 MG capsule Take 40 mg by mouth daily.  3   No current facility-administered medications for this visit.     Family History  Problem Relation Age of Onset  . Thyroid disease Father        Hypothyroidism  . Breast cancer Maternal Grandmother 60  . Anxiety disorder Maternal Grandmother   . Hypertension Maternal Grandfather   . Heart disease Maternal Grandfather   . Heart disease Paternal Grandfather   . Colon cancer Paternal Grandfather   . Parkinson's disease Paternal Grandfather     Review of Systems  HENT:       Acne   All other systems reviewed and are negative.   Exam:   BP 122/62   Pulse 68   Temp 98.1 F (36.7 C) (Temporal)   Ht 5\' 10"  (1.778 m)   Wt 145 lb (65.8 kg)   LMP 06/05/2019 (Approximate)   BMI 20.81 kg/m    Height: 5\' 10"  (177.8 cm)  Ht Readings from Last 3 Encounters:  06/21/19 5\' 10"  (1.778 m)  02/24/18 5\' 10"  (1.778 m)  12/29/16 5\' 10"  (1.778 m)    General appearance: alert, cooperative and appears stated age Head: Normocephalic, without obvious abnormality, atraumatic  Neck: no adenopathy, supple, symmetrical, trachea midline and thyroid normal to inspection and palpation Lungs: clear to auscultation bilaterally Breasts: normal appearance, no masses or tenderness Heart: regular rate and rhythm Abdomen: soft, non-tender; bowel sounds normal; no masses,  no organomegaly Extremities: extremities normal, atraumatic, no cyanosis or edema Skin: Skin color, texture, turgor normal. No rashes or lesions Lymph nodes: Cervical, supraclavicular, and axillary nodes normal. No abnormal inguinal nodes palpated Neurologic: Grossly normal   Pelvic: External genitalia:  no lesions              Urethra:  normal appearing urethra with no masses, tenderness or lesions              Bartholins and Skenes: normal                 Vagina: normal appearing vagina with normal color and discharge, no lesions               Cervix: no lesions              Pap taken: Yes.   Bimanual Exam:  Uterus:  normal size, contour, position, consistency, mobility, non-tender              Adnexa: normal adnexa and no mass, fullness, tenderness               Rectovaginal: Confirms               Anus:  normal sphincter tone, no lesions  Chaperone was present for exam.  A:  Well Woman with normal exam Acne SA with new partner this past year GERD ADHD Anxiety Family hx of breast cancer in MGM aged 23 at diagnosis   P:   Mammogram guidelines reviewed pap smear obtained today GC/chl obtained today Testosterone level obtained today Return for PUS Loestrin Fe 1.5/30 rx not needed today.  She has two more packs. Return annually or prn

## 2019-06-24 LAB — TESTOSTERONE, TOTAL, LC/MS/MS: Testosterone, total: 15.8 ng/dL (ref 10.0–55.0)

## 2019-06-25 ENCOUNTER — Telehealth: Payer: Self-pay | Admitting: Obstetrics & Gynecology

## 2019-06-25 NOTE — Telephone Encounter (Signed)
Call placed to patient to review benefits and schedule recommended ultrasound. Left voicemail message requesting a return call °

## 2019-06-26 LAB — CYTOLOGY - PAP
Chlamydia: NEGATIVE
Diagnosis: NEGATIVE
Neisseria Gonorrhea: NEGATIVE

## 2019-06-27 NOTE — Telephone Encounter (Signed)
Patient is returning call to [facility] with further questions.

## 2019-06-27 NOTE — Telephone Encounter (Signed)
Returned call to patient. Patient put me on speaker phone with her and her father. Father has requested benefits be reverified, he feels incorrect information was provided. Advised will reverify and contact them with information once reverified

## 2019-07-02 NOTE — Telephone Encounter (Signed)
See previous message. Call placed to patient to advise, benefit was reverified and confirmed the information previously provided is correct. However unable to reach patient and her voice mailbox was full. Unable to leave a message

## 2019-07-05 ENCOUNTER — Ambulatory Visit (INDEPENDENT_AMBULATORY_CARE_PROVIDER_SITE_OTHER): Payer: Managed Care, Other (non HMO)

## 2019-07-05 ENCOUNTER — Ambulatory Visit (INDEPENDENT_AMBULATORY_CARE_PROVIDER_SITE_OTHER): Payer: Managed Care, Other (non HMO) | Admitting: Obstetrics & Gynecology

## 2019-07-05 ENCOUNTER — Other Ambulatory Visit: Payer: Self-pay

## 2019-07-05 VITALS — BP 116/70 | HR 72 | Temp 97.9°F | Ht 70.0 in | Wt 146.0 lb

## 2019-07-05 DIAGNOSIS — N920 Excessive and frequent menstruation with regular cycle: Secondary | ICD-10-CM

## 2019-07-05 DIAGNOSIS — L700 Acne vulgaris: Secondary | ICD-10-CM | POA: Diagnosis not present

## 2019-07-05 DIAGNOSIS — N9489 Other specified conditions associated with female genital organs and menstrual cycle: Secondary | ICD-10-CM

## 2019-07-05 MED ORDER — DROSPIRENONE-ETHINYL ESTRADIOL 3-0.03 MG PO TABS: 1.0000 | ORAL_TABLET | Freq: Every day | ORAL | 4 refills | Status: DC

## 2019-07-05 NOTE — Progress Notes (Signed)
23 y.o. G0P0000 Single White or Caucasian female here for pelvic ultrasound due to evaluation for PCOS due to increased acne.  Pt is trying to decide about using IUD for contraception.  Also, has reported increased acne this last year.  Seeing dermatologist.  In her own reading, she learned about PCOS and desired evaluation.  Testosterone level was 15 (while on pills) so ultrasound recommended.  She is here for this today as well as discussion about contraception.  After the acne, she does desires her cramping with her cycle to improve is possible.  She is aware IUDs typically decrease bleeding and this is a reason she is considering this option.    Patient's last menstrual period was 07/03/2019 (exact date).  Contraception: OCPs  Findings:  UTERUS: 7.7 5.7 x 3.4 cm EMS: 3.8 mm ADNEXA: Left ovary: 2.8 x 1.1 x 1.6 cm.  Ovarian volume is 2.6 cm.       Right ovary: 2.6 x 1.5 x 1.5 cm.  Ovarian volume is 3.1 cm.  Normal follicle patterns noted on the ovaries. CUL DE SAC: No free fluid  Discussion: Findings reviewed with patient.  There is no evidence on ultrasound today of polycystic ovarian syndrome.  Her ovarian volumes are both normal and there are normal follicular patterns noted.  We did had a lengthy discussion about changing oral contraceptives from Loestrin 1.5/30 to Yasmin.  The differences in the progesterones were discussed.  Possible improvement of acne was also discussed.  We discussed continuous active OCPs.  She has never done this before does have some interest in this as well.  Lastly we discussed IUD use.  We would likely use a Kyleena or a Mirena but would have to decide this the day of the visit based on the uterine measurement.  Patient had many questions we discussed these individually.  She is going to try Yasmin.  Increased risks of serum potassium level with specific medications reviewed.  She knows to call if she starts any of his medications.  OCP risks including headache  nausea DVT, PE, stroke and MI were all reviewed.  Assessment:  Cramping Worsening acne this past year H/O menorrhagia that has improved with OCPs SA Non smoker  Plan:  Will switch to Yasmin, 1 tab po q day.  #3 month supply/71F.  She is going to call and give an update in two to three months.  She is aware that we could switch the Yasmin or her prior OCP to continuous active use.  At this time, I think she would try that option before proceeding with an IUD.  Of course if she changes her mind, she knows to call and we can get the IUD placement planned.  ~Greater than 30 minutes spent with the patient with all of it in face-to-face discussion of her ultrasound results and questions that are related to options for improving acne and cramping.

## 2019-07-06 ENCOUNTER — Encounter: Payer: Self-pay | Admitting: Obstetrics & Gynecology

## 2019-07-09 NOTE — Telephone Encounter (Signed)
Appointment completed on 07/05/2019

## 2019-08-19 ENCOUNTER — Other Ambulatory Visit: Payer: Self-pay | Admitting: Obstetrics & Gynecology

## 2019-08-20 NOTE — Telephone Encounter (Signed)
Medication refill request: Junel Last AEX:  07/09/19 SM Next AEX: 08/28/2020 Last MMG (if hormonal medication request): n/a Refill authorized: #[id number] with 3 refills if appropriate. Please advise.

## 2019-11-06 ENCOUNTER — Encounter: Payer: Self-pay | Admitting: Family

## 2019-11-06 ENCOUNTER — Ambulatory Visit (INDEPENDENT_AMBULATORY_CARE_PROVIDER_SITE_OTHER): Payer: 59 | Admitting: Family

## 2019-11-06 ENCOUNTER — Other Ambulatory Visit: Payer: Self-pay

## 2019-11-06 VITALS — BP 106/68 | HR 68 | Resp 16 | Ht 70.67 in | Wt 141.6 lb

## 2019-11-06 DIAGNOSIS — R480 Dyslexia and alexia: Secondary | ICD-10-CM

## 2019-11-06 DIAGNOSIS — J45990 Exercise induced bronchospasm: Secondary | ICD-10-CM | POA: Diagnosis not present

## 2019-11-06 DIAGNOSIS — F900 Attention-deficit hyperactivity disorder, predominantly inattentive type: Secondary | ICD-10-CM

## 2019-11-06 DIAGNOSIS — Z719 Counseling, unspecified: Secondary | ICD-10-CM

## 2019-11-06 DIAGNOSIS — Z79899 Other long term (current) drug therapy: Secondary | ICD-10-CM

## 2019-11-06 DIAGNOSIS — R278 Other lack of coordination: Secondary | ICD-10-CM | POA: Diagnosis not present

## 2019-11-06 DIAGNOSIS — F819 Developmental disorder of scholastic skills, unspecified: Secondary | ICD-10-CM

## 2019-11-06 DIAGNOSIS — Z8379 Family history of other diseases of the digestive system: Secondary | ICD-10-CM

## 2019-11-06 DIAGNOSIS — Z7189 Other specified counseling: Secondary | ICD-10-CM

## 2019-11-06 MED ORDER — COTEMPLA XR-ODT 8.6 MG PO TBED: 8.6000 mg | EXTENDED_RELEASE_TABLET | Freq: Every day | ORAL | 0 refills | Status: DC

## 2019-11-06 NOTE — Progress Notes (Signed)
Medical Follow-up  Patient ID: HEDY GARRO  DOB: [birth year]  MRN: [id number]  DATE:11/06/19 Veronica Dimitri, MD  Accompanied by: Self Patient Lives with: parents  HISTORY/CURRENT STATUS: HPI Patient here by herself for the visit today. Patient is interactive and appropriate with provider. Patient now working and graduated from [employer]. Remotely working from home at her parents house. Patient was on Daytrana Patch 30 mg daily, but had increased side effects with sleep initiation on the patch. Patient looking at restarting medications and possible other options for treatment.   EDUCATION: School: [employer] Year/Grade: Graduated  Working: [place] Schedule: Monday through Friday most weeks  Activities: some  Screen Time: minimal outside of work Driving: No issues with driving  MEDICAL HISTORY: Appetite: None reported now, medication with have an effect on her appetite  Sleep: Bedtime: 12:30 am Awakens: 7:30 am the latest Sleep Concerns: stay asleep most nights, Melatonin for sleep initiation.   Allergies:  Allergies  Allergen Reactions  . Amoxicillin Rash   Current Medications:  Not on medication regularly, occasionally using the patches Medication Side Effects: None  Individual Medical History/Review of System Changes? None  Family Medical/Social History Changes?: No  MENTAL HEALTH: Mental Health Issues:  Denies sadness, loneliness or depression. No self harm or thoughts of self harm or injury. Denies fears, worries and anxieties. Has good peer relations and is not a bully nor is victimized.  ROS: Review of Systems  Psychiatric/Behavioral: Positive for decreased concentration and sleep disturbance.  All other systems reviewed and are negative.  PHYSICAL EXAM: Vitals:   11/06/19 0813  Resp: 16  Weight: 141 lb 9.6 oz (64.2 kg)  Height: 5' 10.67" (1.795 m)   Body mass index is 19.93 kg/m.  General Exam: Physical Exam Vitals signs reviewed.  Constitutional:      Appearance: Normal appearance. She is well-developed and normal weight.  HENT:     Head: Normocephalic and atraumatic.     Right Ear: Tympanic membrane, ear canal and external ear normal.     Left Ear: Tympanic membrane, ear canal and external ear normal.     Nose: Nose normal.     Mouth/Throat:     Mouth: Mucous membranes are moist.  Eyes:     Conjunctiva/sclera: Conjunctivae normal.     Pupils: Pupils are equal, round, and reactive to light.  Neck:     Musculoskeletal: Normal range of motion and neck supple.  Cardiovascular:     Rate and Rhythm: Normal rate and regular rhythm.     Pulses: Normal pulses.     Heart sounds: Normal heart sounds.  Pulmonary:     Effort: Pulmonary effort is normal.     Breath sounds: Normal breath sounds.  Abdominal:     General: Bowel sounds are normal.     Palpations: Abdomen is soft.  Musculoskeletal: Normal range of motion.  Skin:    General: Skin is warm and dry.     Capillary Refill: Capillary refill takes less than 2 seconds.  Neurological:     General: No focal deficit present.     Mental Status: She is alert and oriented to person, place, and time.     Deep Tendon Reflexes: Reflexes are normal and symmetric.  Psychiatric:        Mood and Affect: Mood normal.        Behavior: Behavior normal.        Thought Content: Thought content normal.        Judgment: Judgment normal.   Neurological:  oriented to time, place, and person  Testing/Developmental Screens:   Reviewed with patient and reviewed concerns today.  DIAGNOSES:    ICD-10-CM   1. ADHD (attention deficit hyperactivity disorder), inattentive type  F90.0   2. Dysgraphia  R27.8   3. Family history of GERD  Z83.79   4. Exercise-induced asthma  J45.990   5. Learning difficulty  F81.9   6. Dyslexia  R48.0   7. Medication management  Z79.899   8. Patient counseled  Z71.9   9. Goals of care, counseling/discussion  Z71.89    RECOMMENDATIONS:  Counseling at this visit included  the review of old records and/or current chart with the patient with updates since last f/u appointment in the office.   Discussed recent history and today's examination with patient with no changes on exam today.   Recommended a high protein, low sugar diet, watch portion sizes, avoid second helpings, avoid sugary snacks and drinks, drink more water, eat more fruits and vegetables, increase daily exercise.  Discussed current work concerns and performance issues related not being on her medication.   Discussed importance of maintaining structure, routine, organization, reward, motivation and consequences with consistency at work and home settings.   Counseled medication pharmacokinetics, options, dosage, administration, desired effects, and possible side effects.   Cotempla XR 8.6 mg daily, no RF's. RX for above e-scribed and sent to pharmacy on record  [place], [place] - [address] [place] [address] Phone: [phone] Fax: [phone]  Advised importance of:  Good sleep hygiene (8- 10 hours per night, no TV or video games for 1 hour before bedtime) Limited screen time (none on school nights, no more than 2 hours/day on weekends, use of screen time for motivation) Regular exercise(outside and active play) Healthy eating (drink water or milk, no sodas/sweet tea, limit portions and no seconds).   Patient verbalized understanding of all topics discussed.  NEXT APPOINTMENT: Return in about 4 weeks (around 12/04/2019) for follow up visit.  Medical Decision-making: More than 50% of the appointment was spent counseling and discussing diagnosis and management of symptoms with the patient and family.  I discussed the assessment and treatment plan with the parent. The parent was provided an opportunity to ask questions and all were answered. The parent agreed with the plan and demonstrated an understanding of the instructions.    The parent was advised to call back or seek an in-person evaluation if the symptoms worsen or if the condition fails to improve as anticipated.  Counseling Time: 40 minutes Total Contact Time: 50 minutes

## 2019-12-07 ENCOUNTER — Other Ambulatory Visit: Payer: Self-pay

## 2019-12-07 ENCOUNTER — Ambulatory Visit (INDEPENDENT_AMBULATORY_CARE_PROVIDER_SITE_OTHER): Payer: 59 | Admitting: Family

## 2019-12-07 ENCOUNTER — Encounter: Payer: Self-pay | Admitting: Family

## 2019-12-07 DIAGNOSIS — F819 Developmental disorder of scholastic skills, unspecified: Secondary | ICD-10-CM

## 2019-12-07 DIAGNOSIS — R480 Dyslexia and alexia: Secondary | ICD-10-CM

## 2019-12-07 DIAGNOSIS — R278 Other lack of coordination: Secondary | ICD-10-CM

## 2019-12-07 DIAGNOSIS — Z79899 Other long term (current) drug therapy: Secondary | ICD-10-CM

## 2019-12-07 DIAGNOSIS — J45990 Exercise induced bronchospasm: Secondary | ICD-10-CM

## 2019-12-07 DIAGNOSIS — F900 Attention-deficit hyperactivity disorder, predominantly inattentive type: Secondary | ICD-10-CM

## 2019-12-07 DIAGNOSIS — Z719 Counseling, unspecified: Secondary | ICD-10-CM

## 2019-12-07 MED ORDER — COTEMPLA XR-ODT 8.6 MG PO TBED: 8.6000 mg | EXTENDED_RELEASE_TABLET | Freq: Two times a day (BID) | ORAL | 0 refills | Status: DC

## 2019-12-07 NOTE — Progress Notes (Signed)
[facility]. [address] [place] [address] Dept: [phone] Dept Fax: [phone]  Medication Check visit via Virtual Video due to COVID-19  Patient ID:  Veronica Rice  female DOB: [birth year]   23 y.o.   MRN: [id number]   DATE:12/07/19  PCP: Cari Caraway, MD  Virtual Visit via Video Note  I connected with  Veronica Rice on 12/07/19 at  7:30 AM EST by a video enabled telemedicine application and verified that I am speaking with the correct person using two identifiers. Patient/Parent Location: at home   I discussed the limitations, risks, security and privacy concerns of performing an evaluation and management service by telephone and the availability of in person appointments. I also discussed with the parents that there may be a patient responsible charge related to this service. The parents expressed understanding and agreed to proceed.  Provider: Carolann Littler, NP  Location: private location  HISTORY/CURRENT STATUS: Veronica Rice is here for medication management of the psychoactive medications for ADHD and review of educational and behavioral concerns.   [name] currently taking Cotempla 8.6 mg 1 1/2 daily, which is working well. Takes medication at 7-8:00 am. Medication tends to wear off around 6-7:00 pm. [name] is able to focus through work.   [name] is eating well (eating breakfast, lunch and dinner). Eating with no changes.   Sleeping well (goes to bed at 10-11:00 pm wakes at 7:15 am), sleeping through the night. No issues with sleep.  EDUCATION/WORK: Working: [employer] Schedule: Monday through Friday most weeks  Activities/ Exercise: intermittently  Screen time: (phone, tablet, TV, computer): computer from home, TV, phone and movies.   MEDICAL HISTORY: Individual Medical History/ Review of Systems: Changes? :None reported recently.  Family  Medical/ Social History: Changes? None recently reported.  Patient Lives with: parents  Current Medications:  Current Outpatient Medications  Medication Instructions  . Cotempla XR-ODT 8.6 mg, Oral, 2 times daily  . drospirenone-ethinyl estradiol (YASMIN) 3-0.03 MG tablet 1 tablet, Oral, Daily  . JUNEL FE 1.5/30 1.5-30 MG-MCG tablet TAKE 1 TABLET BY MOUTH EVERY DAY  . omeprazole (PRILOSEC) 40 mg, Oral, Daily   Medication Side Effects: None  MENTAL HEALTH: Mental Health Issues:   None     DIAGNOSES:    ICD-10-CM   1. ADHD (attention deficit hyperactivity disorder), inattentive type  F90.0   2. Dysgraphia  R27.8   3. Exercise-induced asthma  J45.990   4. Learning difficulty  F81.9   5. Dyslexia  R48.0   6. Medication management  Z79.899   7. Patient counseled  Z71.9     RECOMMENDATIONS:  Discussed recent history with patient with updates for work, health and medication.   Discussed school work progress and recommended help as needed.   Children and young adults with ADHD often suffer from disorganization, difficulty with time management, completing projects and other executive function difficulties.  Recommended Reading: "Smart but Scattered" and "Smart but Scattered Teens" by Peg Renato Battles and Ethelene Browns.    Discussed continued need for structure, routine, reward (external), motivation (internal), positive reinforcement, consequences, and organization with work and home.   Encouraged recommended limitations on TV, tablets, phones, video games and computers for non-educational activities.   Discussed need for bedtime routine, use of good sleep hygiene, no video games, TV or phones for an hour before bedtime.   Encouraged physical activity and outdoor play, maintaining social distancing.   Counseled medication pharmacokinetics, options, dosage, administration,  desired effects, and possible side effects.   Cotempla 8.6 mg 1 BID, # 60 with no RF's. RX for above e-scribed and  sent to pharmacy on record  [facility] [place], [place] - [address] [place] [address] Phone: [phone] Fax: [phone]  I discussed the assessment and treatment plan with the patient. The patient was provided an opportunity to ask questions and all were answered. The patient agreed with the plan and demonstrated an understanding of the instructions.   I provided of non-face-to-face time during this encounter.   Completed record review for 10 minutes prior to the virtual video visit.   NEXT APPOINTMENT:  Return in about 3 months (around 03/06/2020) for follow up visit.  The patient was advised to call back or seek an in-person evaluation if the symptoms worsen or if the condition fails to improve as anticipated.  Medical Decision-making: More than 50% of the appointment was spent counseling and discussing diagnosis and management of symptoms with the patient and family.  Carron Curie, NP

## 2019-12-10 ENCOUNTER — Telehealth: Payer: Self-pay

## 2019-12-10 NOTE — Telephone Encounter (Signed)
Pharm faxed in Prior Auth for Pella. Last visit 12/07/2019. Submitting Prior Auth to [facility]

## 2019-12-11 NOTE — Telephone Encounter (Signed)
Approvedon December 21 Effective from 12/10/2019 through 12/09/2020.

## 2020-03-06 ENCOUNTER — Encounter: Payer: Self-pay | Admitting: Family

## 2020-03-06 ENCOUNTER — Ambulatory Visit (INDEPENDENT_AMBULATORY_CARE_PROVIDER_SITE_OTHER): Payer: 59 | Admitting: Family

## 2020-03-06 DIAGNOSIS — Z79899 Other long term (current) drug therapy: Secondary | ICD-10-CM

## 2020-03-06 DIAGNOSIS — R278 Other lack of coordination: Secondary | ICD-10-CM

## 2020-03-06 DIAGNOSIS — Z719 Counseling, unspecified: Secondary | ICD-10-CM

## 2020-03-06 DIAGNOSIS — F900 Attention-deficit hyperactivity disorder, predominantly inattentive type: Secondary | ICD-10-CM

## 2020-03-06 DIAGNOSIS — F819 Developmental disorder of scholastic skills, unspecified: Secondary | ICD-10-CM | POA: Diagnosis not present

## 2020-03-06 MED ORDER — COTEMPLA XR-ODT 8.6 MG PO TBED: 8.6000 mg | EXTENDED_RELEASE_TABLET | Freq: Two times a day (BID) | ORAL | 0 refills | Status: DC

## 2020-03-06 NOTE — Progress Notes (Signed)
[facility] [address], [place] [place] [address] Dept: [phone] Dept Fax: [phone]  Medication Check visit via Virtual Video due to COVID-19  Patient ID:  Veronica Rice  female DOB: 13-Jan-1996   23 y.o.   MRN: [id number]   DATE:03/06/20  PCP: Gweneth Dimitri, MD  Virtual Visit via Video Note  I connected with  Veronica Rice on 03/06/20 at  7:30 AM EDT by a video enabled telemedicine application and verified that I am speaking with the correct person using two identifiers. Patient/Parent Location: at home   I discussed the limitations, risks, security and privacy concerns of performing an evaluation and management service by telephone and the availability of in person appointments. I also discussed with the parents that there may be a patient responsible charge related to this service. The parents expressed understanding and agreed to proceed.  Provider: Carron Curie, NP  Location: private location  HISTORY/CURRENT STATUS: Veronica Rice is here for medication management of the psychoactive medications for ADHD and review of educational and behavioral concerns.   Veronica Rice currently taking Cotempla XR 8.6 mg 1 1/2-2 daily, which is working well. Takes medication as directed daily in the morning. Medication tends to wear off around the early evening. Veronica Rice is able to focus through school/homework.   Veronica Rice is eating well (eating breakfast, lunch and dinner). No changes  Sleeping well (goes to bed at 10-11:00 pm wakes at 7:45 am), sleeping through the night.   EDUCATION/WORK: Working: [employer] Schedule: Monday through Friday most weeks Recent promotion 50-60 hours most weeks  Veronica Rice is currently in distance work situation due to social distancing due to COVID-19 and will continue through: further notice.   Activities/ Exercise: intermittently  Screen time:  (phone, tablet, TV, computer): computer for work, TV, movies and phone.   MEDICAL HISTORY: Individual Medical History/ Review of Systems: Changes? :None  Family Medical/ Social History: Changes? Father with severe reaction to 1st  COVID-19 vaccine  Patient Lives with: parents  Current Medications:  Current Outpatient Medications  Medication Instructions  . Cotempla XR-ODT 8.6 mg, Oral, 2 times daily  . drospirenone-ethinyl estradiol (YASMIN) 3-0.03 MG tablet 1 tablet, Oral, Daily   Medication Side Effects: None  MENTAL HEALTH: Mental Health Issues:   no reported issues    DIAGNOSES:    ICD-10-CM   1. ADHD (attention deficit hyperactivity disorder), inattentive type  F90.0   2. Dysgraphia  R27.8   3. Learning problem  F81.9   4. Medication management  Z79.899   5. Patient counseled  Z71.9    RECOMMENDATIONS:  Discussed recent history with patient with updates for work, health and medications.   Discussed work progress and recommended continued accommodations as needed for current situation.   Discussed health and development and current weight. Recommended healthy food choices, watching portion sizes, avoiding second helpings, avoiding sugary drinks like soda and tea, drinking more water, getting more exercise.   Discussed continued need for structure, routine, reward (external), motivation (internal), positive reinforcement, consequences, and organization with updates for work and home settings.   Encouraged recommended limitations on TV, tablets, phones, video games and computers for non-educational activities.   Discussed need for bedtime routine, use of good sleep hygiene, no video games, TV or phones for an hour before bedtime.   Encouraged physical activity and outdoor play, maintaining social distancing.   Counseled medication pharmacokinetics, options, dosage, administration, desired effects, and possible side effects.  Cotempla XR 8.6 mg 2 times daily, # 60 with no  RF's RX for above e-scribed and sent to pharmacy on record  [place], [place] - [place] [address] Phone: [phone] Fax: [phone]  I discussed the assessment and treatment plan with the patient. The patient was provided an opportunity to ask questions and all were answered. The patient agreed with the plan and demonstrated an understanding of the instructions.   I provided 25 minutes of non-face-to-face time during this encounter.   Completed record review for 10 minutes prior to the virtual video visit.   NEXT APPOINTMENT:  Return in about 3 months (around 06/06/2020) for f/u appt. .  The patient was advised to call back or seek an in-person evaluation if the symptoms worsen or if the condition fails to improve as anticipated.  Medical Decision-making: More than 50% of the appointment was spent counseling and discussing diagnosis and management of symptoms with the patient and family.  Carolann Littler, NP

## 2020-03-12 ENCOUNTER — Telehealth: Payer: Self-pay

## 2020-03-12 NOTE — Telephone Encounter (Signed)
Pharm faxed in Prior Auth for Cotempla. Last visit 03/06/2020. Submitting Prior Auth to [employer]

## 2020-04-10 ENCOUNTER — Ambulatory Visit: Payer: Self-pay | Attending: Internal Medicine

## 2020-04-10 DIAGNOSIS — Z23 Encounter for immunization: Secondary | ICD-10-CM

## 2020-04-10 NOTE — Progress Notes (Signed)
   Covid-19 Vaccination Clinic  Name:  Veronica Rice    MRN: [id number] DOB: [birth year]  04/10/2020  Veronica Rice was observed post Covid-19 immunization for 30 minutes based on pre-vaccination screening without incident. She was provided with Vaccine Information Sheet and instruction to access the V-Safe system.   Veronica Rice was instructed to call 911 with any severe reactions post vaccine: Marland Kitchen Difficulty breathing  . Swelling of face and throat  . A fast heartbeat  . A bad rash all over body  . Dizziness and weakness   Immunizations Administered    Name Date Dose VIS Date Route   Pfizer COVID-19 Vaccine 04/10/2020  9:08 AM 0.3 mL 02/13/2019 Intramuscular   Manufacturer: [employer], [employer]   Lot: [id number]   NDC: [address]-8

## 2020-04-15 ENCOUNTER — Other Ambulatory Visit: Payer: Self-pay | Admitting: Family

## 2020-04-15 MED ORDER — COTEMPLA XR-ODT 8.6 MG PO TBED: 8.6000 mg | EXTENDED_RELEASE_TABLET | Freq: Two times a day (BID) | ORAL | 0 refills | Status: DC

## 2020-04-15 NOTE — Telephone Encounter (Signed)
E-Prescribed 8.6 mg BID directly to  [facility] [place], [place] - [address] [place] [address] Phone: [phone] Fax: [phone]

## 2020-04-15 NOTE — Telephone Encounter (Signed)
Patient called for refill for Cotempla.  Patient last seen 03/06/20, next appointment 06/11/20.  Patient did not specify pharmacy.

## 2020-06-11 ENCOUNTER — Ambulatory Visit (INDEPENDENT_AMBULATORY_CARE_PROVIDER_SITE_OTHER): Payer: 59 | Admitting: Family

## 2020-06-11 ENCOUNTER — Other Ambulatory Visit: Payer: Self-pay

## 2020-06-11 ENCOUNTER — Encounter: Payer: Self-pay | Admitting: Family

## 2020-06-11 VITALS — BP 102/64 | HR 68 | Resp 16 | Ht 70.0 in | Wt 141.6 lb

## 2020-06-11 DIAGNOSIS — R278 Other lack of coordination: Secondary | ICD-10-CM | POA: Diagnosis not present

## 2020-06-11 DIAGNOSIS — Z79899 Other long term (current) drug therapy: Secondary | ICD-10-CM

## 2020-06-11 DIAGNOSIS — K2100 Gastro-esophageal reflux disease with esophagitis, without bleeding: Secondary | ICD-10-CM

## 2020-06-11 DIAGNOSIS — Z719 Counseling, unspecified: Secondary | ICD-10-CM

## 2020-06-11 DIAGNOSIS — Z8659 Personal history of other mental and behavioral disorders: Secondary | ICD-10-CM

## 2020-06-11 DIAGNOSIS — Z7189 Other specified counseling: Secondary | ICD-10-CM

## 2020-06-11 DIAGNOSIS — F900 Attention-deficit hyperactivity disorder, predominantly inattentive type: Secondary | ICD-10-CM

## 2020-06-11 MED ORDER — COTEMPLA XR-ODT 25.9 MG PO TBED: 25.9000 mg | EXTENDED_RELEASE_TABLET | Freq: Every day | ORAL | 0 refills | Status: DC

## 2020-06-11 NOTE — Progress Notes (Signed)
[facility] [address] [place] [address] Dept: [phone] Dept Fax: [phone] Loc: [phone] Loc Fax: [phone]  Medication Check  Patient ID: Veronica Rice, female  DOB: 01-Dec-1996, 24 y.o.  MRN: [id number]  Date of Evaluation: 06/11/2020 PCP: Cari Caraway, MD  Accompanied by: self Patient Lives with: parents  HISTORY/CURRENT STATUS: HPI Patient here by herself today for the visit. Patient interactive and appropriate with provider at the visit. Patient's work is going well and more of an important roll at work with more days/hours. No health problems or changes. Patient having some difficulties with coverage of her current dose of Cotempla XR 8.6 x 2 daily with no side effects reported, but limited efficacy.   EDUCATION/WORK: Working: [employer] Schedule: Monday through Friday most weeks Recent promotion 50-60 hours most weeks Activities/ Exercise: intermittently  MEDICAL HISTORY: Appetite:No issues  MVI/Other: None   Getting a variety of foods  Sleep: Bedtime: 12:30 am  Awakens: 6-7:00 am  Concerns: Initiation/Maintenance/Other: Trouble falling asleep  Individual Medical History/ Review of Systems: Changes? :None reported recently  Allergies: Amoxicillin  Current Medications.  Current Outpatient Medications  Medication Instructions  . Cotempla XR-ODT 25.9 mg, Oral, Daily  . drospirenone-ethinyl estradiol (YASMIN) 3-0.03 MG tablet 1 tablet, Oral, Daily   Medication Side Effects: None  Family Medical/ Social History: Changes? Yes, mother was in the hospital due to diverticulitis.   MENTAL HEALTH: Mental Health Issues: History of anxiety  PHYSICAL EXAM; Vitals:  Vitals:   06/11/20 0807  BP: 102/64  Pulse: 68  Resp: 16  Height: 5\' 10"  (1.778 m)  Weight: 141 lb 9.6 oz (64.2 kg)  BMI (Calculated): 20.32    General Physical Exam: Unchanged from previous exam, date: None  Changed:None  DIAGNOSES:    ICD-10-CM   1. ADHD (attention deficit hyperactivity disorder), inattentive type  F90.0   2. Dysgraphia  R27.8   3. Gastroesophageal reflux disease with esophagitis without hemorrhage  K21.00   4. History of anxiety  Z86.59   5. Medication management  Z79.899   6. Patient counseled  Z71.9   7. Goals of care, counseling/discussion  Z71.89    RECOMMENDATIONS:  Counseling at this visit included the review of old records and/or current chart with the patient with updates for work, schedule, responsibilities, health and medications.   Discussed recent history and today's examination with patient with no changes on exam today.   Counseled regarding health care and updates for routine follow ups.   Recommended a high protein, low sugar diet, watch portion sizes, avoid second helpings, avoid sugary snacks and drinks, drink more water, eat more fruits and vegetables, increase daily exercise.  Discussed work progress and advocated for appropriate accommodations needed for her job working at home.   Discussed importance of maintaining structure, routine, organization, reward, motivation and consequences with consistency with home, work and social settings.   Counseled medication pharmacokinetics, options, dosage, administration, desired effects, and possible side effects.   Cotempla XR 25.9 mg daily # 30 with no RF's RX for above e-scribed and sent to pharmacy on record  [place], [place] - [place] [address] Phone: [phone] Fax: [phone]  Discussed possible pharmacogenetic testing for medication management. Information provided to patient today and to call for insurance coverage.   Advised importance of:  Good sleep hygiene (8- 10 hours  per night, no TV or video games for 1 hour before bedtime) Limited screen  time (none on school nights, no more than 2 hours/day on weekends, use of screen time for motivation) Regular exercise(outside and active play) Healthy eating (drink water or milk, no sodas/sweet tea, limit portions and no seconds).   NEXT APPOINTMENT: Return in about 3 months (around 09/11/2020) for f/u visit.  Medical Decision-making: More than 50% of the appointment was spent counseling and discussing diagnosis and management of symptoms with the patient and family.  Carron Curie, NP Counseling Time: 30 mins  Total Contact Time: 40 mins

## 2020-06-12 ENCOUNTER — Telehealth: Payer: Self-pay

## 2020-06-12 NOTE — Telephone Encounter (Signed)
Outcome Approvedtoday Effective from 06/12/2020 through 06/12/2021.

## 2020-06-12 NOTE — Telephone Encounter (Signed)
Submitting Prior Auth for Cotempla to [employer]

## 2020-07-25 ENCOUNTER — Other Ambulatory Visit: Payer: Self-pay | Admitting: Obstetrics & Gynecology

## 2020-07-25 DIAGNOSIS — N920 Excessive and frequent menstruation with regular cycle: Secondary | ICD-10-CM

## 2020-07-25 NOTE — Telephone Encounter (Signed)
Patient calling to check status of request.

## 2020-07-25 NOTE — Telephone Encounter (Signed)
Notified prescription was on Dr Garen Lah desk for approval. Pt said that she is out and just wanted to check on it.   Julious Payer, CMA

## 2020-07-25 NOTE — Telephone Encounter (Signed)
Medication refill request: Drospirenone - EE 3-0.03mg  Last AEX:  06/21/19 Next OV:  08/28/20 Last MMG (if hormonal medication request): NA Refill authorized: 84/4

## 2020-08-26 NOTE — Progress Notes (Deleted)
24 y.o. G0P0000 Single White or Caucasian female here for annual exam.    No LMP recorded.          Sexually active: {yes no:314532}  The current method of family planning is {contraception:315051}.    Exercising: {yes no:314532}  {types:19826} Smoker:  {YES NO:22349}  Health Maintenance: Pap:  02-24-18 neg, 06-21-2019 neg History of abnormal Pap:  no MMG:  none Colonoscopy:  none BMD:   none TDaP:  2019 Pneumonia vaccine(s):  no Shingrix:   no Hep C testing: *** Screening Labs: ***   reports that she has never smoked. She has never used smokeless tobacco. She reports current alcohol use of about 3.0 - 5.0 standard drinks of alcohol per week. She reports that she does not use drugs.  Past Medical History:  Diagnosis Date  . ADD (attention deficit disorder) since age 28-8   mild hyperactitivy aspect  . Anxiety   . Asthma    Sports Induced  . GERD (gastroesophageal reflux disease)     Past Surgical History:  Procedure Laterality Date  . APPENDECTOMY  2011  . NOSE SURGERY  05/2015   turbinate reduction and fracture correction  . WISDOM TOOTH EXTRACTION  2013    Current Outpatient Medications  Medication Sig Dispense Refill  . drospirenone-ethinyl estradiol (YASMIN) 3-0.03 MG tablet TAKE 1 TABLET BY MOUTH EVERY DAY 84 tablet 0  . Methylphenidate (COTEMPLA XR-ODT) 25.9 MG TBED Take 25.9 mg by mouth daily. 30 tablet 0   No current facility-administered medications for this visit.    Family History  Problem Relation Age of Onset  . Thyroid disease Father        Hypothyroidism  . Breast cancer Maternal Grandmother 60  . Anxiety disorder Maternal Grandmother   . Hypertension Maternal Grandfather   . Heart disease Maternal Grandfather   . Heart disease Paternal Grandfather   . Colon cancer Paternal Grandfather   . Parkinson's disease Paternal Grandfather     Review of Systems  Exam:   There were no vitals taken for this visit.     General appearance: alert,  cooperative and appears stated age Head: Normocephalic, without obvious abnormality, atraumatic Neck: no adenopathy, supple, symmetrical, trachea midline and thyroid {EXAM; THYROID:18604} Lungs: clear to auscultation bilaterally Breasts: {Exam; breast:13139::"normal appearance, no masses or tenderness"} Heart: regular rate and rhythm Abdomen: soft, non-tender; bowel sounds normal; no masses,  no organomegaly Extremities: extremities normal, atraumatic, no cyanosis or edema Skin: Skin color, texture, turgor normal. No rashes or lesions Lymph nodes: Cervical, supraclavicular, and axillary nodes normal. No abnormal inguinal nodes palpated Neurologic: Grossly normal   Pelvic: External genitalia:  no lesions              Urethra:  normal appearing urethra with no masses, tenderness or lesions              Bartholins and Skenes: normal                 Vagina: normal appearing vagina with normal color and discharge, no lesions              Cervix: {exam; cervix:14595}              Pap taken: {yes no:314532} Bimanual Exam:  Uterus:  {exam; uterus:12215}              Adnexa: {exam; adnexa:12223}               Rectovaginal: Confirms  Anus:  normal sphincter tone, no lesions  Chaperone, ***Terence Lux, CMA, was present for exam.  A:  Well Woman with normal exam  P:   {plan; gyn:5269::"mammogram","pap smear","return annually or prn"}

## 2020-08-28 ENCOUNTER — Ambulatory Visit: Payer: Managed Care, Other (non HMO) | Admitting: Obstetrics & Gynecology

## 2020-09-18 ENCOUNTER — Other Ambulatory Visit: Payer: Self-pay

## 2020-09-18 NOTE — Telephone Encounter (Signed)
Patient called in for refill for Cotempla. Last visit 06/11/2020 next visit 09/26/2020. Please escribe to [facility] in [place]

## 2020-09-19 MED ORDER — COTEMPLA XR-ODT 25.9 MG PO TBED: 25.9000 mg | EXTENDED_RELEASE_TABLET | Freq: Every day | ORAL | 0 refills | Status: DC

## 2020-09-19 NOTE — Telephone Encounter (Signed)
Resent Rx today for Cotempla XR ODT 25.9 mg daily, # 30 with no RF's.RX for above e-scribed and sent to pharmacy on record  [facility] [place], [place] - [address] [place] [address] Phone: [phone] Fax: [phone]

## 2020-09-19 NOTE — Addendum Note (Signed)
Addended by: Carron Curie on: 09/19/2020 09:03 AM   Modules accepted: Orders

## 2020-09-19 NOTE — Telephone Encounter (Signed)
Cotempla XR ODT 25.9 mg daily, # 30 with no RF's.Malena Peer for above e-scribed and sent to pharmacy on record  [facility] [place], [place] - [address] [place] [address] Phone: [phone] Fax: [phone]

## 2020-09-22 MED ORDER — COTEMPLA XR-ODT 25.9 MG PO TBED: 25.9000 mg | EXTENDED_RELEASE_TABLET | Freq: Every day | ORAL | 0 refills | Status: DC

## 2020-09-22 NOTE — Telephone Encounter (Signed)
Resent Rx 

## 2020-09-22 NOTE — Addendum Note (Signed)
Addended by: Carron Curie on: 09/22/2020 10:19 AM   Modules accepted: Orders

## 2020-09-22 NOTE — Addendum Note (Signed)
Addended by: Carron Curie on: 09/22/2020 08:00 AM   Modules accepted: Orders

## 2020-09-22 NOTE — Telephone Encounter (Signed)
[address] no longer in business was bought by [employer] please send

## 2020-09-22 NOTE — Telephone Encounter (Signed)
Resent Rx to new pharmacy. Cotempla XR ODT 25.9 mg daily, # 30 with no RF's.RX for above e-scribed and sent to pharmacy on record  [facility] [place], [place] - [address] [place] [address] Phone: [phone] Fax: [phone]

## 2020-09-22 NOTE — Addendum Note (Signed)
Addended by: Burgess Estelle on: 09/22/2020 09:46 AM   Modules accepted: Orders

## 2020-09-26 ENCOUNTER — Telehealth (INDEPENDENT_AMBULATORY_CARE_PROVIDER_SITE_OTHER): Payer: 59 | Admitting: Family

## 2020-09-26 ENCOUNTER — Other Ambulatory Visit: Payer: Self-pay

## 2020-09-26 ENCOUNTER — Encounter: Payer: Self-pay | Admitting: Family

## 2020-09-26 DIAGNOSIS — F900 Attention-deficit hyperactivity disorder, predominantly inattentive type: Secondary | ICD-10-CM | POA: Diagnosis not present

## 2020-09-26 DIAGNOSIS — Z79899 Other long term (current) drug therapy: Secondary | ICD-10-CM

## 2020-09-26 DIAGNOSIS — R278 Other lack of coordination: Secondary | ICD-10-CM

## 2020-09-26 DIAGNOSIS — J4520 Mild intermittent asthma, uncomplicated: Secondary | ICD-10-CM

## 2020-09-26 DIAGNOSIS — Z719 Counseling, unspecified: Secondary | ICD-10-CM

## 2020-09-26 DIAGNOSIS — Z7189 Other specified counseling: Secondary | ICD-10-CM

## 2020-09-26 NOTE — Progress Notes (Signed)
[place] [facility] [address], [place]. [address] [place] [address] Dept: [phone] Dept Fax: [phone]  Medication Check visit via Virtual Video due to COVID-19  Patient ID:  Veronica Rice  female DOB: 09-21-1996   23 y.o.   MRN: [id number]   DATE:09/26/20  PCP: Gweneth Dimitri, MD  Virtual Visit via Video Note  I connected with  Veronica Rice on 09/26/20 at  8:00 AM EDT by a video enabled telemedicine application and verified that I am speaking with the correct person using two identifiers. Patient/Parent Location: at home   I discussed the limitations, risks, security and privacy concerns of performing an evaluation and management service by telephone and the availability of in person appointments. I also discussed with the parents that there may be a patient responsible charge related to this service. The parents expressed understanding and agreed to proceed.  Provider: Carron Curie, NP  Location: private location  HISTORY/CURRENT STATUS: Veronica Rice is here for medication management of the psychoactive medications for ADHD and review of educational and behavioral concerns.   Larrisa currently taking Cotempla XR, which is working well. Takes medication at 7-8:00 am. Medication tends to wear off around evening time. Yolunda is able to focus through school/homework.   Clarence is eating well (eating breakfast, lunch and dinner). Eating well with no issues  Sleeping well (goes to bed at 10:00 pm wakes at 7:00 am), sleeping through the night.   EDUCATION/WORK: Working: [employer] Schedule: Monday through Friday most weeks Recent promotion 50-60 hours most weeks  Activities/ Exercise: intermittently  Screen time: (phone, tablet, TV, computer): computer for learning, TV, movies, and phone  MEDICAL HISTORY: Individual Medical History/ Review of Systems: Changes? :None reported since  last f/u visit.   Family Medical/ Social History: Changes? No Patient Lives with: parents  Current Medications:  Current Outpatient Medications on File Prior to Visit  Medication Sig Dispense Refill   drospirenone-ethinyl estradiol (YASMIN) 3-0.03 MG tablet TAKE 1 TABLET BY MOUTH EVERY DAY 84 tablet 0   Methylphenidate (COTEMPLA XR-ODT) 25.9 MG TBED Take 25.9 mg by mouth daily. 30 tablet 0   No current facility-administered medications on file prior to visit.   Medication Side Effects: None  MENTAL HEALTH: Mental Health Issues:   Anxiety-none reported with work or overwhelmed with increased business with work schedule.   DIAGNOSES:    ICD-10-CM   1. ADHD (attention deficit hyperactivity disorder), inattentive type  F90.0   2. Dysgraphia  R27.8   3. Mild intermittent asthma without complication  J45.20   4. Medication management  Z79.899   5. Patient counseled  Z71.9   6. Goals of care, counseling/discussion  Z71.89    RECOMMENDATIONS:  Discussed recent history with patient with updates for work, schedule, burn out, health and medications.   Discussed work progress and recommended continued accommodations as needed for work schedule.   Discussed growth and development and current weight. Recommended healthy food choices, watching portion sizes, avoiding second helpings, avoiding sugary drinks like soda and tea, drinking more water, getting more exercise.   Discussed continued need for structure, routine, reward (external), motivation (internal), positive reinforcement, consequences, and organization with work, home and peer interactions.   Encouraged recommended limitations on TV, tablets, phones, video games and computers for non-educational activities.   Discussed need for bedtime routine, use of good sleep hygiene, no video games, TV or phones for an hour before bedtime.   Encouraged physical activity and  outdoor play, maintaining social distancing.   Counseled  medication pharmacokinetics, options, dosage, administration, desired effects, and possible side effects.   Cotempla XR 25.9 mg daily, no Rx today   I discussed the assessment and treatment plan with the patient/parent. The patient/parent was provided an opportunity to ask questions and all were answered. The patient agreed with the plan and demonstrated an understanding of the instructions.   I provided 25 minutes of non-face-to-face time during this encounter.   Completed record review for 10 minutes prior to the virtual video visit.   NEXT APPOINTMENT:  Return in about 3 months (around 12/27/2020) for f/u visit.  The patient was advised to call back or seek an in-person evaluation if the symptoms worsen or if the condition fails to improve as anticipated.  Medical Decision-making: More than 50% of the appointment was spent counseling and discussing diagnosis and management of symptoms with the patient and family.  Carron Curie, NP

## 2020-10-19 ENCOUNTER — Other Ambulatory Visit: Payer: Self-pay | Admitting: Obstetrics & Gynecology

## 2020-10-19 DIAGNOSIS — N920 Excessive and frequent menstruation with regular cycle: Secondary | ICD-10-CM

## 2020-10-20 NOTE — Telephone Encounter (Signed)
Medication refill request: OCP Last AEX:  06-21-2019 SM  Next AEX: 02-26-21 JJ  Last MMG (if hormonal medication request): n/a Refill authorized: Today, please advise.   Medication pended for #84, 1 RF. Please refill if appropriate.

## 2020-11-10 ENCOUNTER — Other Ambulatory Visit: Payer: Self-pay

## 2020-11-10 MED ORDER — COTEMPLA XR-ODT 25.9 MG PO TBED: 25.9000 mg | EXTENDED_RELEASE_TABLET | Freq: Every day | ORAL | 0 refills | Status: DC

## 2020-11-10 NOTE — Telephone Encounter (Signed)
Cotempla XR ODT 25.9 mg daily, # 30 with no RF's.RX for above e-scribed and sent to pharmacy on record  [facility] [place], [place] - [address] [place] [address] Phone: [phone] Fax: [phone]

## 2020-11-10 NOTE — Telephone Encounter (Signed)
Patient called in for refill for Cotempla. Last visit 09/26/2020 next visit 12/24/2020. Please escribe to [facility] in [place]

## 2020-11-11 MED ORDER — COTEMPLA XR-ODT 25.9 MG PO TBED: 25.9000 mg | EXTENDED_RELEASE_TABLET | Freq: Every day | ORAL | 0 refills | Status: DC

## 2020-11-11 NOTE — Addendum Note (Signed)
Addended by: Burgess Estelle on: 11/11/2020 09:39 AM   Modules accepted: Orders

## 2020-11-11 NOTE — Addendum Note (Signed)
Addended by: Carron Curie on: 11/11/2020 10:51 AM   Modules accepted: Orders

## 2020-11-11 NOTE — Telephone Encounter (Signed)
Patient called in stating that [facility] can no longer supply he med due to her insurance. Patient would like med sent to [facility] in [place], [place]

## 2020-11-11 NOTE — Telephone Encounter (Signed)
Cotempla XR 25.9 mg daily, #30 with no RF's. Sent to different pharmacy.RX for above e-scribed and sent to pharmacy on record  [id number] - [place], [place] - [address] [place] [address] Phone: [phone] Fax: [phone]

## 2020-11-19 ENCOUNTER — Encounter: Payer: Self-pay | Admitting: Obstetrics & Gynecology

## 2020-12-24 ENCOUNTER — Telehealth (INDEPENDENT_AMBULATORY_CARE_PROVIDER_SITE_OTHER): Payer: 59 | Admitting: Family

## 2020-12-24 ENCOUNTER — Encounter: Payer: Self-pay | Admitting: Family

## 2020-12-24 ENCOUNTER — Other Ambulatory Visit: Payer: Self-pay

## 2020-12-24 DIAGNOSIS — J4520 Mild intermittent asthma, uncomplicated: Secondary | ICD-10-CM

## 2020-12-24 DIAGNOSIS — R278 Other lack of coordination: Secondary | ICD-10-CM | POA: Diagnosis not present

## 2020-12-24 DIAGNOSIS — F900 Attention-deficit hyperactivity disorder, predominantly inattentive type: Secondary | ICD-10-CM

## 2020-12-24 DIAGNOSIS — Z8379 Family history of other diseases of the digestive system: Secondary | ICD-10-CM

## 2020-12-24 DIAGNOSIS — Z719 Counseling, unspecified: Secondary | ICD-10-CM

## 2020-12-24 DIAGNOSIS — Z7189 Other specified counseling: Secondary | ICD-10-CM

## 2020-12-24 DIAGNOSIS — Z8659 Personal history of other mental and behavioral disorders: Secondary | ICD-10-CM

## 2020-12-24 DIAGNOSIS — Z79899 Other long term (current) drug therapy: Secondary | ICD-10-CM

## 2020-12-24 MED ORDER — COTEMPLA XR-ODT 25.9 MG PO TBED: 25.9000 mg | EXTENDED_RELEASE_TABLET | Freq: Every day | ORAL | 0 refills | Status: DC

## 2020-12-24 NOTE — Progress Notes (Signed)
[place] [facility] [address] [place]. [address] [place] [address] Dept: [phone] Dept Fax: [phone]  Medication Check visit via Virtual Video   Patient ID:  Veronica Rice  female DOB: December 20, 1996   25 y.o.   MRN: [id number]   DATE:12/24/20  PCP: Gweneth Dimitri, MD  Virtual Visit via Video Note  I connected with  Veronica Rice on 12/24/20 at  3:00 PM EST by a video enabled telemedicine application and verified that I am speaking with the correct person using two identifiers. Patient/Parent Location: at home   I discussed the limitations, risks, security and privacy concerns of performing an evaluation and management service by telephone and the availability of in person appointments. I also discussed with the parents that there may be a patient responsible charge related to this service. The parents expressed understanding and agreed to proceed.  Provider: Carron Curie, NP  Location: private work location  HISTORY/CURRENT STATUS: Veronica Rice is here for medication management of the psychoactive medications for ADHD and review of educational and behavioral concerns.   Veronica Rice currently taking Cotempla XR 25.9 mg daily, which is working well. Takes medication at the same time in the morning. Medication tends to wear off around evening time. Veronica Rice is able to focus through work.   Veronica Rice is eating well (eating breakfast, lunch and dinner). Eating with no recent changes reported by patient.   Sleeping well (goes to bed at 10-11:00 pm wakes at 7-8:00 am), sleeping through the night.   EDUCATION/WORK: Working: [employer] Schedule: Monday through Friday most weeks Recent promotion 50-60 hours most weeks  Activities/ Exercise: intermittently  Screen time: (phone, tablet, TV, computer): computer for learning, TV, phone and movies.   MEDICAL HISTORY: Individual Medical History/ Review  of Systems: Changes? :Yes had COVID-19 recently with minimal symptoms.   Family Medical/ Social History: Changes? None reported recently Has BF that she sees regularly  Patient Lives with: parents  Current Medications:  Current Outpatient Medications  Medication Instructions  . Cotempla XR-ODT 25.9 mg, Oral, Daily  . SYEDA 3-0.03 MG tablet TAKE 1 TABLET BY MOUTH EVERY DAY   Medication Side Effects: None  MENTAL HEALTH: Mental Health Issues:   Anxiety-decreased now with work and life balance.    DIAGNOSES:    ICD-10-CM   1. ADHD (attention deficit hyperactivity disorder), inattentive type  F90.0   2. Dysgraphia  R27.8   3. Mild intermittent asthma without complication  J45.20   4. History of anxiety  Z86.59   5. Family history of GERD  Z83.79   6. Medication management  Z79.899   7. Patient counseled  Z71.9   8. Goals of care, counseling/discussion  Z71.89     RECOMMENDATIONS:  Discussed recent history with patient with working from home to continue, hours, schedule, health and medications.   Discussed work progress and recommended continued accommodations as needed for work at home for now.   Discussed growth and development and current weight. Recommended healthy food choices, watching portion sizes, avoiding second helpings, avoiding sugary drinks like soda and tea, drinking more water, getting more exercise.   Discussed continued need for structure, routine, reward (external), motivation (internal), positive reinforcement, consequences, and organization with work, schedule, social, and emotional interations.   Encouraged recommended limitations on TV, tablets, phones, video games and computers for non-educational activities.   Discussed need for bedtime routine, use of good sleep hygiene, no video games, TV or phones for an hour  before bedtime.   Counseled medication pharmacokinetics, options, dosage, administration, desired effects, and possible side effects.   Cotempla  XR 25.9 mg daily, # 30 with no RF's.RX for above e-scribed and sent to pharmacy on record  [id number] - [place], [place] - [address] [place] [address] Phone: [phone] Fax: [phone]  I discussed the assessment and treatment plan with the patien. The patient was provided an opportunity to ask questions and all were answered. The patient agreed with the plan and demonstrated an understanding of the instructions.   I provided 25 minutes of non-face-to-face time during this encounter. Completed record review for 10 minutes prior to the virtual video visit.   NEXT APPOINTMENT:  Return in about 3 months (around 03/24/2021) for f/u visit.  The patient was advised to call back or seek an in-person evaluation if the symptoms worsen or if the condition fails to improve as anticipated.  Medical Decision-making: More than 50% of the appointment was spent counseling and discussing diagnosis and management of symptoms with the patient and family.  Carron Curie, NP

## 2021-01-21 ENCOUNTER — Other Ambulatory Visit: Payer: Self-pay

## 2021-01-21 MED ORDER — COTEMPLA XR-ODT 25.9 MG PO TBED: 25.9000 mg | EXTENDED_RELEASE_TABLET | Freq: Every day | ORAL | 0 refills | Status: DC

## 2021-01-21 NOTE — Telephone Encounter (Signed)
Last visit 12/25/2019 next visit 03/25/2021

## 2021-01-21 NOTE — Telephone Encounter (Signed)
Cotempla XR 25.9 mg daily, #30 with no RF's.RX for above e-scribed and sent to pharmacy on record  [id number] - [place], [place] - [address] [place] [address] Phone: [phone] Fax: [phone]     

## 2021-01-23 ENCOUNTER — Other Ambulatory Visit: Payer: Self-pay

## 2021-01-23 MED ORDER — AZSTARYS 26.1-5.2 MG PO CAPS: 26.1000 mg | ORAL_CAPSULE | Freq: Every day | ORAL | 0 refills | Status: DC

## 2021-01-23 NOTE — Telephone Encounter (Signed)
Patient would like to Switch to Azstarys due to the having high ded and Cotempla being to high

## 2021-01-23 NOTE — Telephone Encounter (Signed)
Azstarys 26.1-5.2 mg daily, # 30 with no RF's.RX for above e-scribed and sent to pharmacy on record  [id number] - [place], [place] - [address] [place] [address] Phone: [phone] Fax: [phone]

## 2021-02-26 ENCOUNTER — Ambulatory Visit: Payer: Managed Care, Other (non HMO) | Admitting: Obstetrics and Gynecology

## 2021-03-25 ENCOUNTER — Other Ambulatory Visit: Payer: Self-pay

## 2021-03-25 ENCOUNTER — Other Ambulatory Visit (HOSPITAL_BASED_OUTPATIENT_CLINIC_OR_DEPARTMENT_OTHER): Payer: Self-pay

## 2021-03-25 ENCOUNTER — Telehealth (INDEPENDENT_AMBULATORY_CARE_PROVIDER_SITE_OTHER): Payer: 59 | Admitting: Family

## 2021-03-25 ENCOUNTER — Encounter: Payer: Self-pay | Admitting: Family

## 2021-03-25 DIAGNOSIS — Z719 Counseling, unspecified: Secondary | ICD-10-CM | POA: Diagnosis not present

## 2021-03-25 DIAGNOSIS — K2100 Gastro-esophageal reflux disease with esophagitis, without bleeding: Secondary | ICD-10-CM | POA: Diagnosis not present

## 2021-03-25 DIAGNOSIS — R278 Other lack of coordination: Secondary | ICD-10-CM | POA: Diagnosis not present

## 2021-03-25 DIAGNOSIS — F900 Attention-deficit hyperactivity disorder, predominantly inattentive type: Secondary | ICD-10-CM | POA: Diagnosis not present

## 2021-03-25 DIAGNOSIS — Z79899 Other long term (current) drug therapy: Secondary | ICD-10-CM

## 2021-03-25 DIAGNOSIS — Z7189 Other specified counseling: Secondary | ICD-10-CM

## 2021-03-25 MED ORDER — COTEMPLA XR-ODT 25.9 MG PO TBED
25.9000 mg | EXTENDED_RELEASE_TABLET | Freq: Every day | ORAL | 0 refills | Status: DC
  Filled 2021-03-25: qty 30, 30d supply, fill #0

## 2021-03-25 MED ORDER — COTEMPLA XR-ODT 25.9 MG PO TBED
25.9000 mg | EXTENDED_RELEASE_TABLET | Freq: Every day | ORAL | 0 refills | Status: DC
Start: 2021-03-25 — End: 2021-03-25

## 2021-03-25 NOTE — Progress Notes (Signed)
[place] [facility] [address], [place]. [address] [place] [address] Dept: [phone] Dept Fax: [phone]  Medication Check visit via Virtual Video   Patient ID:  Veronica Rice  female DOB: [birth year]   25 y.o.   MRN: [id number]   DATE:03/25/21  PCP: Gweneth Dimitri, MD  Virtual Visit via Video Note  I connected with  [name] on 03/25/21 at  7:30 AM EDT by a video enabled telemedicine application and verified that I am speaking with the correct person using two identifiers. Patient/Parent Location: at home   I discussed the limitations, risks, security and privacy concerns of performing an evaluation and management service by telephone and the availability of in person appointments. I also discussed with the parents that there may be a patient responsible charge related to this service. The parents expressed understanding and agreed to proceed.  Provider: Carron Curie, NP  Location: private work location  HPI/CURRENT STATUS: Veronica Rice is here for medication management of the psychoactive medications for ADHD and review of educational and behavioral concerns.   [name] currently taking Cotempla XR 25.9 mg daily, which is working well. Takes medication as directed on work mornings.  Medication tends to wear off around evening time . [name] is able to focus through home and work tasks.   [name] is eating well (eating breakfast, lunch and dinner). Eating well   Sleeping well (getting plenty of sleep each night), sleeping through the night.   EDUCATION/WORK: Working: [employer] Schedule: Monday through Friday most weeks Still remote 50-60 hours most weeks Promotion with her company and new roll  Activities/ Exercise: intermittently-tries to get some exercise in when able to recently.   Screen time: (phone, tablet, TV, computer): computer for work, phone and TV when has time.    MEDICAL HISTORY: Individual Medical History/ Review of Systems: None reported recently.  Family Medical/ Social History: Changes? Yes, recently bough condo in [place] Patient Lives with: parents  MENTAL HEALTH: Mental Health Issues:   Anxiety-more with recent job change and new condo purchase.  Allergies: Allergies  Allergen Reactions  . Amoxicillin Rash    Current Medications:  Current Outpatient Medications on File Prior to Visit  Medication Sig Dispense Refill  . omeprazole (PRILOSEC) 40 MG capsule Take 1 capsule by mouth daily.    Marland Kitchen SYEDA 3-0.03 MG tablet TAKE 1 TABLET BY MOUTH EVERY DAY 84 tablet 1   No current facility-administered medications on file prior to visit.   Medication Side Effects: None  DIAGNOSES:    ICD-10-CM   1. ADHD (attention deficit hyperactivity disorder), inattentive type  F90.0   2. Dysgraphia  R27.8   3. Gastroesophageal reflux disease with esophagitis without hemorrhage  K21.00   4. Patient counseled  Z71.9   5. Medication management  Z79.899   6. Goals of care, counseling/discussion  Z71.89    ASSESSMENT: Patient doing well at work with recent promotion. Now participating in training for the new position. No external or internal accommodations needed for work duties. Working from home remotely with choice to go into the office, if she wants to for social interactions. Recently purchased a condo in [place] and will be moving shortly into her new place. No recent issues with health, eating or sleeping. Getting some exercise in when able to with work and house hunting. Has coninued with Cotempla XR for now with no side effects or adverse effects with proven efficacy. Wanted to change due to cost  with new year. Recently paid out of pocket for the Rx and may consider changing medications after training for new position and move is completed. No changes today with Rx dose.  PLAN/RECOMMENDATIONS:  Patient provided updates for work, new  position, health, and mediations.  Working at the same job with new position in [employer] with training for the new job with no difficulties or help needed to perform her daily job duties.  Moving into a condo that she purchased in [place]. closer to her work location, Excited about her own place with purchasing on her own. Support and encouragement provided.   Discussed exercise with attempting to get some in with work change and looking for houses. Encouraged to continue with some movement or activity daily, if possible. Has continued with current diet and no changes.  Eating a healthy diet and including a good amount of water.   Has continued with daily structure and routine with work schedule. Positive reinforcement and motivation with continued success at work.   Counseled medication pharmacokinetics, options, dosage, administration, desired effects, and possible side effects.   Cotempla XR 25.9 mg dally, # 30 with no RF's RX for above e-scribed and sent to pharmacy on record  [facility] [address] [place] [address] Phone: [phone] Fax: [phone]   I discussed the assessment and treatment plan with the patient. The patient was provided an opportunity to ask questions and all were answered. The patient agreed with the plan and demonstrated an understanding of the instructions.   I provided 25 minutes of non-face-to-face time during this encounter. Completed record review for 10 minutes prior to the virtual video visit.   NEXT APPOINTMENT:  06/24/2021  Return in about 3 months (around 06/24/2021) for f/u visit.  The patient was advised to call back or seek an in-person evaluation if the symptoms worsen or if the condition fails to improve as anticipated.   Carron Curie, NP

## 2021-04-05 ENCOUNTER — Other Ambulatory Visit: Payer: Self-pay | Admitting: Obstetrics and Gynecology

## 2021-04-05 DIAGNOSIS — N920 Excessive and frequent menstruation with regular cycle: Secondary | ICD-10-CM

## 2021-05-27 ENCOUNTER — Other Ambulatory Visit: Payer: Self-pay

## 2021-05-28 MED ORDER — COTEMPLA XR-ODT 25.9 MG PO TBED: 25.9000 mg | EXTENDED_RELEASE_TABLET | Freq: Every day | ORAL | 0 refills | Status: DC

## 2021-05-28 NOTE — Telephone Encounter (Signed)
E-Prescribed Cotempla XR ODT 25.9 directly to  [facility] #[id number] - [place], [place] - [address] [place] [address] Phone: [phone] Fax: [phone]

## 2021-06-09 ENCOUNTER — Ambulatory Visit (INDEPENDENT_AMBULATORY_CARE_PROVIDER_SITE_OTHER): Payer: Managed Care, Other (non HMO) | Admitting: Nurse Practitioner

## 2021-06-09 ENCOUNTER — Other Ambulatory Visit: Payer: Self-pay

## 2021-06-09 VITALS — BP 132/80 | HR 66 | Ht 69.5 in | Wt 140.6 lb

## 2021-06-09 DIAGNOSIS — Z01419 Encounter for gynecological examination (general) (routine) without abnormal findings: Secondary | ICD-10-CM

## 2021-06-09 DIAGNOSIS — Z3041 Encounter for surveillance of contraceptive pills: Secondary | ICD-10-CM

## 2021-06-09 DIAGNOSIS — E282 Polycystic ovarian syndrome: Secondary | ICD-10-CM

## 2021-06-09 MED ORDER — DROSPIRENONE-ETHINYL ESTRADIOL 3-0.03 MG PO TABS: 1.0000 | ORAL_TABLET | Freq: Every day | ORAL | 3 refills | Status: DC

## 2021-06-09 NOTE — Progress Notes (Signed)
   Veronica Rice 1996-03-31 [id number]   History:  25 y.o. G0 presents for annual exam without GYN complaints. PCOS diagnosed in 2020. Monthly cycles while on OCPs. Normal pap history. ADHD, anxiety managed by psychiatry. Sexually active with long-term partner. Sister has celiac disease and patient is thinking about seeing GI for workup. She experiences bloating and flatulence at times.   Gynecologic History Patient's last menstrual period was 06/08/2021.   Contraception/Family planning: OCP (estrogen/progesterone)  Health Maintenance Last Pap: 06/21/2019. Results were: Normal Last mammogram: Not indicated Last colonoscopy: Not indicated Last Dexa: Not indicated  Past medical history, past surgical history, family history and social history were all reviewed and documented in the EPIC chart. Works for [employer].  ROS:  A ROS was performed and pertinent positives and negatives are included.  Exam:  Vitals:   06/09/21 1540  BP: 132/80  Pulse: 66  SpO2: 100%  Weight: 140 lb 9.6 oz (63.8 kg)  Height: 5' 9.5" (1.765 m)   Body mass index is 20.47 kg/m.  General appearance:  Normal Thyroid:  Symmetrical, normal in size, without palpable masses or nodularity. Respiratory  Auscultation:  Clear without wheezing or rhonchi Cardiovascular  Auscultation:  Regular rate, without rubs, murmurs or gallops  Edema/varicosities:  Not grossly evident Abdominal  Soft,nontender, without masses, guarding or rebound.  Liver/spleen:  No organomegaly noted  Hernia:  None appreciated  Skin  Inspection:  Grossly normal Breasts: Examined lying and sitting.   Right: Without masses, retractions, nipple discharge or axillary adenopathy.   Left: Without masses, retractions, nipple discharge or axillary adenopathy. Genitourinary - Declined due to menses  Assessment/Plan:  25 y.o. G0 for annual exam.   Well female exam with routine gynecological exam - Education provided on  SBEs, importance of preventative screenings, current guidelines, high calcium diet, regular exercise, and multivitamin daily.  Family history of celiac disease - sister diagnosed with celiac disease. Patient occasionally experiences bloating and flatulence. Recommend seeing GI for workup. She wants to see GI provider that her sister sees.   Encounter for surveillance of contraceptive pills - Plan: drospirenone-ethinyl estradiol (SYEDA) 3-0.03 MG tablet daily. Taking as prescribed. Refill x 1 year provided.   PCOS (polycystic ovarian syndrome) - Plan: drospirenone-ethinyl estradiol (SYEDA) 3-0.03 MG tablet daily. Having regular cycles with use.   Screening for cervical cancer - Normal Pap history.  Will repeat at 3-year interval per guidelines.  Return in 1 year for annual.    Olivia Mackie DNP, 3:58 PM 06/09/2021

## 2021-06-24 ENCOUNTER — Telehealth (INDEPENDENT_AMBULATORY_CARE_PROVIDER_SITE_OTHER): Payer: 59 | Admitting: Family

## 2021-06-24 ENCOUNTER — Encounter: Payer: Self-pay | Admitting: Family

## 2021-06-24 ENCOUNTER — Other Ambulatory Visit: Payer: Self-pay

## 2021-06-24 DIAGNOSIS — Z7189 Other specified counseling: Secondary | ICD-10-CM

## 2021-06-24 DIAGNOSIS — K2100 Gastro-esophageal reflux disease with esophagitis, without bleeding: Secondary | ICD-10-CM

## 2021-06-24 DIAGNOSIS — Z79899 Other long term (current) drug therapy: Secondary | ICD-10-CM | POA: Diagnosis not present

## 2021-06-24 DIAGNOSIS — R278 Other lack of coordination: Secondary | ICD-10-CM

## 2021-06-24 DIAGNOSIS — F900 Attention-deficit hyperactivity disorder, predominantly inattentive type: Secondary | ICD-10-CM | POA: Diagnosis not present

## 2021-06-24 DIAGNOSIS — Z719 Counseling, unspecified: Secondary | ICD-10-CM

## 2021-06-24 NOTE — Progress Notes (Signed)
[facility] [address], [place]. [address] [place] [address] Dept: [phone] Dept Fax: [phone]  Medication Check visit via Virtual Video   Patient ID:  Veronica Rice  female DOB: 1996/10/22   25 y.o.   MRN: [id number]   DATE:06/24/21  PCP: Gweneth Dimitri, MD  Virtual Visit via Video Note  I connected with  [name]  on 06/24/21 at  8:00 AM EDT by a video enabled telemedicine application and verified that I am speaking with the correct person using two identifiers. Patient/Parent Location: at home   I discussed the limitations, risks, security and privacy concerns of performing an evaluation and management service by telephone and the availability of in person appointments. I also discussed with the parents that there may be a patient responsible charge related to this service. The parents expressed understanding and agreed to proceed.  Provider: Carron Curie, NP  Location: private work location  HPI/CURRENT STATUS: RUBINA BASINSKI is here for medication management of the psychoactive medications for ADHD and review of educational and behavioral concerns.   Maurisha currently taking Cotempla XR daily, which is working well. Takes medication at 7:00 am on work days. Medication tends to wear off around in the evening. Nevin is able to focus through work.   Maitlyn is eating well (eating breakfast, lunch and dinner). Eating well with no recent changes.   Sleeping well (getting plenty of sleep each night), sleeping through the night. No issues reported.   EDUCATION: Working: [place] Schedule: Monday through Friday most weeks  Still remote 50-60 hours most weeks Promotion with her company and new roll with good progress  Activities/ Exercise: intermittently  Screen time: (phone, tablet, TV, computer): computer for work, TV, movies and phone.   MEDICAL HISTORY: Individual  Medical History/ Review of Systems:   Family Medical/ Social History: Changes? No Patient Lives with:  by herself  MENTAL HEALTH: Mental Health Issues:    None     Allergies: Allergies  Allergen Reactions   Amoxicillin Rash   Current Medications:  Current Outpatient Medications on File Prior to Visit  Medication Sig Dispense Refill   drospirenone-ethinyl estradiol (SYEDA) 3-0.03 MG tablet Take 1 tablet by mouth daily. 84 tablet 3   Methylphenidate (COTEMPLA XR-ODT) 25.9 MG TBED Take 1 tablet by mouth daily. 30 tablet 0   No current facility-administered medications on file prior to visit.   Medication Side Effects: None  DIAGNOSES:    ICD-10-CM   1. ADHD (attention deficit hyperactivity disorder), inattentive type  F90.0     2. Dysgraphia  R27.8     3. Gastroesophageal reflux disease with esophagitis without hemorrhage  K21.00     4. Medication management  Z79.899     5. Patient counseled  Z71.9     6. Goals of care, counseling/discussion  Z71.89      ASSESSMENT: Veronica Rice is a 25 year old female with a history of ADHD with medication management of her symptoms with Cotempla XR with good efficacy. No current side effects reported. Has continued with working from home part time with in the office 2 days/week. Veronica Rice has reported no difficulties with job performance or duties that she needs assistance with to complete her daily tasks. Has f/u recently for routine health care and other medication management. Recently moved to [place] for her job and socializing more. No changes in eating or sleeping habits. Will continued with her current Cotempla XR, but may need  an alternative by the end of the year due to insurance change due to cost. Will f/u in 3 months for routine medication management.     PLAN/RECOMMENDATIONS:  Patient provided updates with work, schedule, relocation and health changes since last f/u visit.   Doing well at work with working from home 3 days/week  and going into the office on Tuesdays and Thursdays. No help needed with performing work tasks or daily duties.  Reviewed health and recent updates with routine care for GYN along with OC's. New Dx of PCOS from GYN and will continue to monitor symptoms.   Daily structure and routine for work and home with working from home the majority of the time. Now living in [place] by herself and attempting to manage her new environment and making personal progress. Support and encouragement provided to patient.   Encouraged healthy eating habits daily and routine exercise to stay healthy.   Sleep hygiene reviewed with sleep schedule to obtain 8-10 hours of sleep each night.   Counseled medication pharmacokinetics, options, dosage, administration, desired effects, and possible side effects.   Cotempla XR 25.9 mg daily, no Rx today To change pharmacies to [place] with next RF.   I discussed the assessment and treatment plan with the patient. The patient was provided an opportunity to ask questions and all were answered. The patient agreed with the plan and demonstrated an understanding of the instructions.   I provided 28 minutes of non-face-to-face time during this encounter. Completed record review for 12 minutes prior to the virtual video visit.   NEXT APPOINTMENT:  09/30/2021  Return in about 3 months (around 09/24/2021) for f/u visit.  The patient was advised to call back or seek an in-person evaluation if the symptoms worsen or if the condition fails to improve as anticipated.   Carron Curie, NP

## 2021-07-30 ENCOUNTER — Other Ambulatory Visit: Payer: Self-pay

## 2021-07-30 MED ORDER — COTEMPLA XR-ODT 25.9 MG PO TBED: 25.9000 mg | EXTENDED_RELEASE_TABLET | Freq: Every day | ORAL | 0 refills | Status: DC

## 2021-07-30 NOTE — Telephone Encounter (Signed)
Cotempla XR 25.9 mg daily, #30 with no RF's.RX for above e-scribed and sent to pharmacy on record  [id number] - [place], [place] - [address] [place] [address] Phone: [phone] Fax: [phone]     

## 2021-08-18 ENCOUNTER — Telehealth: Payer: Self-pay

## 2021-08-25 NOTE — Telephone Encounter (Signed)
Patient would like Cotempla sent to [facility] in [place], [place]

## 2021-08-25 NOTE — Addendum Note (Signed)
Addended by: Burgess Estelle on: 08/25/2021 02:18 PM   Modules accepted: Orders

## 2021-08-26 MED ORDER — COTEMPLA XR-ODT 25.9 MG PO TBED: 25.9000 mg | EXTENDED_RELEASE_TABLET | Freq: Every day | ORAL | 0 refills | Status: DC

## 2021-08-26 NOTE — Addendum Note (Signed)
Addended by: Carron Curie on: 08/26/2021 10:39 AM   Modules accepted: Orders

## 2021-08-26 NOTE — Telephone Encounter (Signed)
Cotempla XR 25.9 mg daily, # 30 with no RF's.RX for above e-scribed and sent to pharmacy on record  [id number] - [place], [place] - [address] [place] [address] Phone: [phone] Fax: [phone]   

## 2021-09-29 ENCOUNTER — Telehealth: Payer: Self-pay | Admitting: Family

## 2021-09-29 MED ORDER — COTEMPLA XR-ODT 25.9 MG PO TBED: 25.9000 mg | EXTENDED_RELEASE_TABLET | Freq: Every day | ORAL | 0 refills | Status: DC

## 2021-09-29 NOTE — Telephone Encounter (Signed)
Cotempla XR 25.9 mg daily, # 30 with no RF's.RX for above e-scribed and sent to pharmacy on record  [facility][id number] - [place], [place] - [phone] AT [facility] [address] [place] [address] Phone: [phone] Fax: [phone]

## 2021-09-30 ENCOUNTER — Institutional Professional Consult (permissible substitution): Payer: 59 | Admitting: Family

## 2021-10-28 ENCOUNTER — Ambulatory Visit (INDEPENDENT_AMBULATORY_CARE_PROVIDER_SITE_OTHER): Payer: 59 | Admitting: Family

## 2021-10-28 ENCOUNTER — Encounter: Payer: Self-pay | Admitting: Family

## 2021-10-28 ENCOUNTER — Other Ambulatory Visit: Payer: Self-pay

## 2021-10-28 VITALS — BP 112/68 | HR 76 | Resp 16 | Ht 70.0 in | Wt 142.4 lb

## 2021-10-28 DIAGNOSIS — Z719 Counseling, unspecified: Secondary | ICD-10-CM

## 2021-10-28 DIAGNOSIS — F900 Attention-deficit hyperactivity disorder, predominantly inattentive type: Secondary | ICD-10-CM

## 2021-10-28 DIAGNOSIS — Z8659 Personal history of other mental and behavioral disorders: Secondary | ICD-10-CM

## 2021-10-28 DIAGNOSIS — R278 Other lack of coordination: Secondary | ICD-10-CM | POA: Diagnosis not present

## 2021-10-28 DIAGNOSIS — Z79899 Other long term (current) drug therapy: Secondary | ICD-10-CM

## 2021-10-28 DIAGNOSIS — Z7189 Other specified counseling: Secondary | ICD-10-CM

## 2021-10-28 MED ORDER — COTEMPLA XR-ODT 25.9 MG PO TBED: 25.9000 mg | EXTENDED_RELEASE_TABLET | Freq: Every day | ORAL | 0 refills | Status: DC

## 2021-10-28 MED ORDER — JORNAY PM 40 MG PO CP24
40.0000 mg | ORAL_CAPSULE | Freq: Every day | ORAL | 0 refills | Status: DC
Start: 2021-10-28 — End: 2022-06-30

## 2021-10-28 NOTE — Progress Notes (Signed)
Medication Check  Patient ID: Veronica Rice  DOB: [birth year]  MRN: [id number]  DATE:10/28/21 Gweneth Dimitri, MD  Accompanied by:  self Patient Lives with:  herself  HISTORY/CURRENT STATUS: HPI Patient here by herself for the visit today. Patient interactive and appropriate with provider. Patient still working full-time with her current company and changed jobs in March. Living in [place] and bough a condo. She is adjusting well and working with no current issues. Looking at changing her insurance for next year due to her turing 25 years old. Medication change and coverage discussed.   EDUCATION/WORK: Working: [place] Schedule: Monday through Friday most weeks  Still remote some days and in the office some days 50-60 hours most weeks Promotion with her company and new roll with good progress  Activities/ Exercise: intermittently  Screen time: (phone, tablet, TV, computer): computer for work, TV, phone, and movies.   Driving: No issues or accidents.   MEDICAL HISTORY: Appetite: Good   Sleep: Getting enough sleep each night  Concerns: Initiation/Maintenance/Other: None Elimination: none  Individual Medical History/ Review of Systems: Changes? :None  Family Medical/ Social History: Changes? Yes Mother recently had surgery.  MENTAL HEALTH: Some anxiousness.   PHYSICAL EXAM; Vitals:   10/28/21 1310  BP: 112/68  Pulse: 76  Resp: 16  Weight: 142 lb 6.4 oz (64.6 kg)  Height: 5\' 10"  (1.778 m)  Physical Exam Vitals reviewed.  Constitutional:      Appearance: Normal appearance. She is well-developed and normal weight.  HENT:     Head: Normocephalic and atraumatic.     Right Ear: Tympanic membrane, ear canal and external ear normal.     Left Ear: Tympanic membrane, ear canal and external ear normal.     Nose: Nose normal.     Mouth/Throat:     Mouth: Mucous membranes are moist.     Pharynx: Oropharynx is clear.  Eyes:     Extraocular Movements: Extraocular  movements intact.     Conjunctiva/sclera: Conjunctivae normal.     Pupils: Pupils are equal, round, and reactive to light.  Cardiovascular:     Rate and Rhythm: Normal rate and regular rhythm.     Pulses: Normal pulses.     Heart sounds: Normal heart sounds.  Pulmonary:     Effort: Pulmonary effort is normal.     Breath sounds: Normal breath sounds.  Abdominal:     General: Bowel sounds are normal.     Palpations: Abdomen is soft.  Musculoskeletal:        General: Normal range of motion.     Cervical back: Normal range of motion and neck supple.  Skin:    General: Skin is warm and dry.     Capillary Refill: Capillary refill takes less than 2 seconds.  Neurological:     General: No focal deficit present.     Mental Status: She is alert and oriented to person, place, and time.     Deep Tendon Reflexes: Reflexes are normal and symmetric.  Psychiatric:        Mood and Affect: Mood normal.        Behavior: Behavior normal.        Thought Content: Thought content normal.        Judgment: Judgment normal.   General Physical Exam: Unchanged from previous exam, date:    Side Effects (None 0, Mild 1, Moderate 2, Severe 3  Headache 0  Stomachache 0  Change of appetite 0  Trouble sleeping 0 Irritability in  the later morning, later afternoon , or evening 0 Socially withdrawn - decreased interaction with others 0  Extreme sadness or unusual crying 0  Dull, tired, listless behavior 0  Tremors/feeling shaky 0 Repetitive movements, tics, jerking, twitching, eye blinking 0 Picking at skin or fingers nail biting, lip or cheek chewing 0  Sees or hears things that aren't there 0  Comments:  none  ASSESSMENT:  Evoleht is 48-years of age with a diagnosis of ADHD and Dysgraphia,  that is well controlled with her Cotempla XR 25.9 mg daily with no side effects. Patient changing insurance as of next year and wanting to discuss change in her medication due to increased cost. Bought a  condominium in [place] and settling in over the past several months. BF is now living with her in the condo. Working for the same company with new roll and progress reported with her new roll. No current concerns or problems with job performance or completion of daily tasks. Getting to go into the office more and physically seeing her coworkers. Eating well with no issues reported. Getting plenty of sleep and on a good sleep routine. Getting in some activity routinely. Discussion of medication change for next year due to insurance change today.   DIAGNOSES:    ICD-10-CM   1. ADHD (attention deficit hyperactivity disorder), inattentive type  F90.0 Methylphenidate HCl ER, PM, (JORNAY PM) 40 MG CP24    2. Dysgraphia  R27.8     3. History of anxiety  Z86.59     4. Medication management  Z79.899     5. Patient counseled  Z71.9     6. Goals of care, counseling/discussion  Z71.89      RECOMMENDATIONS:  Updates for work, schedule, changes in environment, moving to [place], and medical changes in the past 3 months.   Discussed recent move to her conde and adjustment to living on her own. BF did recently move in with her.  Work schedule is the same but now back in the office some days each week. Getting more interaction and interacting with co-workers.  Eating well with no issues and getting plenty of calories in each day with meals.   Encouraged some physical activity each day and encouraged at least 30 minutes daily.   Medical updates and changes to insurance discussed for next year. Discussed options for medications and treatment options.   Sleep hygiene reviewed with patient and getting enough sleep each night with no current issues.   Counseled medication pharmacokinetics, options, dosage, administration, desired effects, and possible side effects. Cotempla XR 25.9 mg HOLD Trial Jornay pm 40 mg at HS, # 30 with no RF's RX for above e-scribed and sent to pharmacy on record  [id number] - [place], [place] - [phone] [facility] [address] [place] [address] Phone: [phone] Fax: [phone]  I discussed the assessment and treatment plan with the patient. The patient was provided an opportunity to ask questions and all were answered. The patient agreed with the plan and demonstrated an understanding of the instructions.  NEXT APPOINTMENT:  Return in about 3 months (around 01/28/2022) for f/u visit.  The patient was advised to call back or seek an in-person evaluation if the symptoms worsen or if the condition fails to improve as anticipated.    Carron Curie, NP

## 2021-10-30 ENCOUNTER — Encounter: Payer: Self-pay | Admitting: Family

## 2022-02-12 ENCOUNTER — Telehealth (INDEPENDENT_AMBULATORY_CARE_PROVIDER_SITE_OTHER): Payer: 59 | Admitting: Family

## 2022-02-12 ENCOUNTER — Other Ambulatory Visit: Payer: Self-pay

## 2022-02-12 ENCOUNTER — Encounter: Payer: Self-pay | Admitting: Family

## 2022-02-12 DIAGNOSIS — Z79899 Other long term (current) drug therapy: Secondary | ICD-10-CM

## 2022-02-12 DIAGNOSIS — K2100 Gastro-esophageal reflux disease with esophagitis, without bleeding: Secondary | ICD-10-CM

## 2022-02-12 DIAGNOSIS — F900 Attention-deficit hyperactivity disorder, predominantly inattentive type: Secondary | ICD-10-CM

## 2022-02-12 DIAGNOSIS — Z8659 Personal history of other mental and behavioral disorders: Secondary | ICD-10-CM

## 2022-02-12 DIAGNOSIS — Z719 Counseling, unspecified: Secondary | ICD-10-CM

## 2022-02-12 DIAGNOSIS — R278 Other lack of coordination: Secondary | ICD-10-CM

## 2022-02-12 DIAGNOSIS — Z7189 Other specified counseling: Secondary | ICD-10-CM

## 2022-02-12 MED ORDER — BUSPIRONE HCL 5 MG PO TABS: 2.5000 mg | ORAL_TABLET | Freq: Two times a day (BID) | ORAL | 2 refills | Status: AC

## 2022-02-12 NOTE — Progress Notes (Signed)
[facility] [address] [place]. [address] [place] [address] Dept: [phone] Dept Fax: [phone]  Medication Check visit via Virtual Video   Patient ID:  Veronica Rice  female DOB: 07-Jan-1996   26 y.o.   MRN: [id number]   DATE:02/12/22  PCP: Gweneth Dimitri, MD  Virtual Visit via Video Note  I connected with  Veronica Rice on 02/12/22 at  8:00 AM EST by a video enabled telemedicine application and verified that I am speaking with the correct person using two identifiers. Patient/Parent Location: at home   I discussed the limitations, risks, security and privacy concerns of performing an evaluation and management service by telephone and the availability of in person appointments. I also discussed with the parents that there may be a patient responsible charge related to this service. The parents expressed understanding and agreed to proceed.  Provider: Carron Curie, NP  Location: private work location  HPI/CURRENT STATUS: Veronica Rice is here for medication management of the psychoactive medications for ADHD and review of educational and behavioral concerns.   Veronica Rice currently taking Cotempla XR 25.9 mg, which is working well. Takes medication at bedtime. Medication tends to wear off around evening. Veronica Rice is able to focus through homework.   Veronica Rice is eating well (eating breakfast, lunch and dinner). Veronica Rice does not have appetite suppression  Sleeping well (goes to bed at 10:00 pm wakes at 7:00 am), sleeping through the night. Veronica Rice does not have delayed sleep onset, but waking each morning at 3:00 am due to anxiety with work related issues. Restless after waking but will go back to sleep.   EDUCATION/WORK: Working: [place] Schedule: Monday through Friday most weeks  Still remote some days and in the office some days 50-60 hours most weeks Promotion with her company  and new roll with good progress  Activities/ Exercise: intermittently-not much recently.   MEDICAL HISTORY: Individual Medical History/ Review of Systems: recent hives related to anxiety and soap/shampoo, some acid reflux. Has been healthy with no visits to the PCP. WCC due yearly.   Family Medical/ Social History: Changes? None reported  Patient Lives with:  by herself  MENTAL HEALTH: Mental Health Issues:   Anxiety-more with responsibilities at work and causing sleep issues.    Allergies: Allergies  Allergen Reactions   Amoxicillin Rash   Current Medications:  Current Outpatient Medications on File Prior to Visit  Medication Sig Dispense Refill   drospirenone-ethinyl estradiol (SYEDA) 3-0.03 MG tablet Take 1 tablet by mouth daily. 84 tablet 3   Methylphenidate HCl ER, PM, (JORNAY PM) 40 MG CP24 Take 40 mg by mouth at bedtime. 30 capsule 0   No current facility-administered medications on file prior to visit.   Medication Side Effects: None  DIAGNOSES:    ICD-10-CM   1. ADHD (attention deficit hyperactivity disorder), inattentive type  F90.0     2. Dysgraphia  R27.8     3. Gastroesophageal reflux disease with esophagitis without hemorrhage  K21.00     4. History of anxiety  Z86.59     5. Medication management  Z79.899     6. Patient counseled  Z71.9     7. Goals of care, counseling/discussion  Z71.89      ASSESSMENT:      Veronica Rice is a 26 year old female with a history of ADHD, Anxiety and Dysgraphia. She has been maintained on Cotempla Xr 25.8 mg daily with her not starting her Jornay pm  40 mg due to work schedule and potential effects on her work with a different medication. More responsibilities and back in the office 3 days/week has caused increased anxiety, which if effecting her sleep. Gong to bed but waking up at 3:00 am worried about work related duties. No reported issues with her job and boss to return this coming week. No changes in health or eating habits.   [name] has not been consistently working out or exercising due to feeling mentally "drained" from work. Options for medication initiation for Jornay to be addressed along with current anxiety related to work.   PLAN/RECOMMENDATIONS:  Updates for work, hours, schedule, office requirements and change in duties discussed with Veronica Rice today.  No changes with company but had changed job position and more responsibilities with boss being out for several months.   More anxiety with changes at work and causing sleep issues with waking.  Not currently working out due to her mental fatigue from work and suggested some activity to decompress.   Recent issues with her GI and rashes over the past several months have been ongoing issues. She is addressing with medication and changes for skin care routine.   Medications discussed with changes to [place] from [place] with less responsibilities at work. Support and education provided.  Buspar to start with 1/2-1 tablet BID for morning and afternoon issues with somatic complaints. May consider counseling if anxiety persists or worsens.    Counseled medication pharmacokinetics, options, dosage, administration, desired effects, and possible side effects.   Cotempla XR to discontinue after start of Jornay pm Jormay pm to start with instructions on use, no Rx today Buspar 5 mg 1/2-1 tablet BID, # 60 with 2 RF's RX for above e-scribed and sent to pharmacy on record  [facility], [address] AT [place] [address] Phone: [phone] Fax: [phone]  I discussed the assessment and treatment plan with the patient. The patient was provided an opportunity to ask questions and all were answered. The patient/ parent agreed with the plan and demonstrated an understanding of the instructions.   NEXT APPOINTMENT:  Visit date not found-f/u visit   Telehealth OK  The patient was advised to call back or seek an in-person evaluation if the symptoms worsen or if the condition fails to improve as anticipated.   Carolann Littler, NP

## 2022-06-10 ENCOUNTER — Ambulatory Visit: Payer: Managed Care, Other (non HMO) | Admitting: Nurse Practitioner

## 2022-06-23 ENCOUNTER — Other Ambulatory Visit: Payer: Self-pay

## 2022-06-23 DIAGNOSIS — Z3041 Encounter for surveillance of contraceptive pills: Secondary | ICD-10-CM

## 2022-06-23 DIAGNOSIS — E282 Polycystic ovarian syndrome: Secondary | ICD-10-CM

## 2022-06-23 MED ORDER — DROSPIRENONE-ETHINYL ESTRADIOL 3-0.03 MG PO TABS: 1.0000 | ORAL_TABLET | Freq: Every day | ORAL | 0 refills | Status: AC

## 2022-06-23 NOTE — Telephone Encounter (Signed)
Last AEX 06/09/21--was scheduled for 06/10/22 but was cancelled by office in 01/2022 due to TW being off that day. Per scheduling-pt was called and LVM twice to call back and r/s. Will also send pt mychart msg.

## 2022-06-24 NOTE — Telephone Encounter (Signed)
FYI

## 2022-06-30 ENCOUNTER — Other Ambulatory Visit: Payer: Self-pay | Admitting: Family

## 2022-06-30 DIAGNOSIS — F900 Attention-deficit hyperactivity disorder, predominantly inattentive type: Secondary | ICD-10-CM

## 2022-06-30 MED ORDER — JORNAY PM 40 MG PO CP24: 40.0000 mg | ORAL_CAPSULE | Freq: Every day | ORAL | 0 refills | Status: DC

## 2022-06-30 NOTE — Telephone Encounter (Signed)
Jornay pm 40 mg daily, # 30 with no RF's.RX for above e-scribed and sent to pharmacy on record  [facility][id number] - [place], [place] - [phone] AT [facility] [address] [place] [address] Phone: [phone] Fax: [phone]

## 2022-07-22 ENCOUNTER — Other Ambulatory Visit: Payer: Self-pay | Admitting: Nurse Practitioner

## 2022-07-22 DIAGNOSIS — E282 Polycystic ovarian syndrome: Secondary | ICD-10-CM

## 2022-07-22 DIAGNOSIS — Z3041 Encounter for surveillance of contraceptive pills: Secondary | ICD-10-CM

## 2022-09-20 ENCOUNTER — Other Ambulatory Visit: Payer: Self-pay | Admitting: Family

## 2022-09-20 DIAGNOSIS — F900 Attention-deficit hyperactivity disorder, predominantly inattentive type: Secondary | ICD-10-CM

## 2022-09-20 MED ORDER — JORNAY PM 40 MG PO CP24: 40.0000 mg | ORAL_CAPSULE | Freq: Every day | ORAL | 0 refills | Status: AC

## 2022-09-20 NOTE — Telephone Encounter (Signed)
Jornay pm 40 mg at HS, # 30 with no RF"s.RX for above e-scribed and sent to pharmacy on record  [facility] [place], [address] AT [place] [address] Phone: [phone] Fax: [phone]
